# Patient Record
Sex: Female | Born: 1955 | Race: White | Hispanic: No | Marital: Married | Smoking: Never smoker
Health system: Southern US, Community
[De-identification: ages and names within clinical notes are randomized; demographics above are authoritative.]

## PROBLEM LIST (undated history)

## (undated) DIAGNOSIS — M1990 Unspecified osteoarthritis, unspecified site: Secondary | ICD-10-CM

## (undated) DIAGNOSIS — F419 Anxiety disorder, unspecified: Secondary | ICD-10-CM

## (undated) DIAGNOSIS — G500 Trigeminal neuralgia: Secondary | ICD-10-CM

## (undated) DIAGNOSIS — R51 Headache: Secondary | ICD-10-CM

## (undated) DIAGNOSIS — K219 Gastro-esophageal reflux disease without esophagitis: Secondary | ICD-10-CM

## (undated) DIAGNOSIS — F32A Depression, unspecified: Secondary | ICD-10-CM

## (undated) DIAGNOSIS — F329 Major depressive disorder, single episode, unspecified: Secondary | ICD-10-CM

## (undated) HISTORY — PX: EPIDURAL BLOCK INJECTION: SHX1516

## (undated) HISTORY — DX: Anxiety disorder, unspecified: F41.9

## (undated) HISTORY — PX: OTHER SURGICAL HISTORY: SHX169

## (undated) HISTORY — PX: TONSILLECTOMY: SUR1361

---
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
## 1975-10-10 HISTORY — PX: PILONIDAL CYST / SINUS EXCISION: SUR543

## 1987-10-10 HISTORY — PX: HAND ARTHROPLASTY: SHX968

## 1999-07-01 ENCOUNTER — Other Ambulatory Visit: Admission: RE | Admit: 1999-07-01 | Discharge: 1999-07-01 | Payer: Self-pay | Admitting: Obstetrics and Gynecology

## 1999-12-16 ENCOUNTER — Other Ambulatory Visit: Admission: RE | Admit: 1999-12-16 | Discharge: 1999-12-16 | Payer: Self-pay | Admitting: Obstetrics and Gynecology

## 2000-10-08 ENCOUNTER — Other Ambulatory Visit: Admission: RE | Admit: 2000-10-08 | Discharge: 2000-10-08 | Payer: Self-pay | Admitting: Obstetrics and Gynecology

## 2000-11-17 ENCOUNTER — Emergency Department (HOSPITAL_COMMUNITY): Admission: EM | Admit: 2000-11-17 | Discharge: 2000-11-17 | Payer: Self-pay | Admitting: *Deleted

## 2001-03-20 ENCOUNTER — Other Ambulatory Visit: Admission: RE | Admit: 2001-03-20 | Discharge: 2001-03-20 | Payer: Self-pay | Admitting: Obstetrics and Gynecology

## 2002-01-29 ENCOUNTER — Other Ambulatory Visit: Admission: RE | Admit: 2002-01-29 | Discharge: 2002-01-29 | Payer: Self-pay | Admitting: *Deleted

## 2003-04-22 ENCOUNTER — Other Ambulatory Visit: Admission: RE | Admit: 2003-04-22 | Discharge: 2003-04-22 | Payer: Self-pay | Admitting: *Deleted

## 2004-11-04 ENCOUNTER — Encounter: Admission: RE | Admit: 2004-11-04 | Discharge: 2004-11-04 | Payer: Self-pay | Admitting: Family Medicine

## 2005-02-09 ENCOUNTER — Other Ambulatory Visit: Admission: RE | Admit: 2005-02-09 | Discharge: 2005-02-09 | Payer: Self-pay | Admitting: *Deleted

## 2005-04-26 ENCOUNTER — Encounter: Admission: RE | Admit: 2005-04-26 | Discharge: 2005-04-26 | Payer: Self-pay | Admitting: Family Medicine

## 2005-10-09 HISTORY — PX: SINUS EXPLORATION: SHX5214

## 2005-10-09 HISTORY — PX: SEPTOPLASTY: SUR1290

## 2005-11-16 ENCOUNTER — Other Ambulatory Visit: Admission: RE | Admit: 2005-11-16 | Discharge: 2005-11-16 | Payer: Self-pay | Admitting: Obstetrics & Gynecology

## 2006-03-27 ENCOUNTER — Encounter: Admission: RE | Admit: 2006-03-27 | Discharge: 2006-03-27 | Payer: Self-pay | Admitting: Oral Surgery

## 2006-05-03 ENCOUNTER — Encounter: Admission: RE | Admit: 2006-05-03 | Discharge: 2006-05-03 | Payer: Self-pay | Admitting: Oral Surgery

## 2006-06-14 ENCOUNTER — Other Ambulatory Visit: Admission: RE | Admit: 2006-06-14 | Discharge: 2006-06-14 | Payer: Self-pay | Admitting: Obstetrics & Gynecology

## 2006-06-21 ENCOUNTER — Encounter: Admission: RE | Admit: 2006-06-21 | Discharge: 2006-06-21 | Payer: Self-pay | Admitting: Oral Surgery

## 2006-09-14 ENCOUNTER — Other Ambulatory Visit: Admission: RE | Admit: 2006-09-14 | Discharge: 2006-09-14 | Payer: Self-pay | Admitting: Obstetrics & Gynecology

## 2006-10-09 HISTORY — PX: ANKLE ARTHROPLASTY: SUR68

## 2007-03-28 ENCOUNTER — Other Ambulatory Visit: Admission: RE | Admit: 2007-03-28 | Discharge: 2007-03-28 | Payer: Self-pay | Admitting: Obstetrics & Gynecology

## 2007-10-10 HISTORY — PX: CARPAL TUNNEL RELEASE: SHX101

## 2007-10-10 HISTORY — PX: JOINT REPLACEMENT: SHX530

## 2007-11-11 ENCOUNTER — Encounter
Admission: RE | Admit: 2007-11-11 | Discharge: 2008-02-09 | Payer: Self-pay | Admitting: Physical Medicine & Rehabilitation

## 2007-11-11 ENCOUNTER — Ambulatory Visit: Payer: Self-pay | Admitting: Physical Medicine & Rehabilitation

## 2007-12-30 ENCOUNTER — Inpatient Hospital Stay (HOSPITAL_COMMUNITY): Admission: RE | Admit: 2007-12-30 | Discharge: 2008-01-03 | Payer: Self-pay | Admitting: Orthopedic Surgery

## 2008-02-20 ENCOUNTER — Other Ambulatory Visit: Admission: RE | Admit: 2008-02-20 | Discharge: 2008-02-20 | Payer: Self-pay | Admitting: Obstetrics & Gynecology

## 2008-03-01 ENCOUNTER — Encounter: Admission: RE | Admit: 2008-03-01 | Discharge: 2008-03-01 | Payer: Self-pay | Admitting: Orthopedic Surgery

## 2008-03-02 ENCOUNTER — Encounter
Admission: RE | Admit: 2008-03-02 | Discharge: 2008-05-31 | Payer: Self-pay | Admitting: Physical Medicine & Rehabilitation

## 2008-03-03 ENCOUNTER — Ambulatory Visit: Payer: Self-pay | Admitting: Physical Medicine & Rehabilitation

## 2008-03-23 ENCOUNTER — Ambulatory Visit: Payer: Self-pay | Admitting: Physical Medicine & Rehabilitation

## 2008-04-16 ENCOUNTER — Ambulatory Visit: Payer: Self-pay | Admitting: Physical Medicine & Rehabilitation

## 2008-04-24 ENCOUNTER — Ambulatory Visit: Payer: Self-pay | Admitting: Physical Medicine & Rehabilitation

## 2008-05-04 ENCOUNTER — Ambulatory Visit: Payer: Self-pay | Admitting: Physical Medicine & Rehabilitation

## 2008-05-29 ENCOUNTER — Encounter
Admission: RE | Admit: 2008-05-29 | Discharge: 2008-08-27 | Payer: Self-pay | Admitting: Physical Medicine & Rehabilitation

## 2008-06-01 ENCOUNTER — Ambulatory Visit: Payer: Self-pay | Admitting: Physical Medicine & Rehabilitation

## 2008-06-11 ENCOUNTER — Ambulatory Visit: Payer: Self-pay | Admitting: Internal Medicine

## 2008-06-25 ENCOUNTER — Ambulatory Visit: Payer: Self-pay | Admitting: Internal Medicine

## 2008-06-30 ENCOUNTER — Ambulatory Visit: Payer: Self-pay | Admitting: Physical Medicine & Rehabilitation

## 2008-07-03 ENCOUNTER — Other Ambulatory Visit: Admission: RE | Admit: 2008-07-03 | Discharge: 2008-07-03 | Payer: Self-pay | Admitting: Obstetrics & Gynecology

## 2008-07-28 ENCOUNTER — Ambulatory Visit: Payer: Self-pay | Admitting: Physical Medicine & Rehabilitation

## 2008-08-06 ENCOUNTER — Ambulatory Visit: Payer: Self-pay | Admitting: Physical Medicine & Rehabilitation

## 2008-08-11 ENCOUNTER — Ambulatory Visit (HOSPITAL_BASED_OUTPATIENT_CLINIC_OR_DEPARTMENT_OTHER): Admission: RE | Admit: 2008-08-11 | Discharge: 2008-08-11 | Payer: Self-pay | Admitting: Orthopedic Surgery

## 2008-08-27 ENCOUNTER — Ambulatory Visit: Payer: Self-pay | Admitting: Physical Medicine & Rehabilitation

## 2008-09-09 ENCOUNTER — Ambulatory Visit: Payer: Self-pay | Admitting: Physical Medicine & Rehabilitation

## 2008-09-09 ENCOUNTER — Encounter
Admission: RE | Admit: 2008-09-09 | Discharge: 2008-12-08 | Payer: Self-pay | Admitting: Physical Medicine & Rehabilitation

## 2008-09-21 ENCOUNTER — Ambulatory Visit: Payer: Self-pay | Admitting: Physical Medicine & Rehabilitation

## 2008-10-05 ENCOUNTER — Ambulatory Visit: Payer: Self-pay | Admitting: Physical Medicine & Rehabilitation

## 2008-10-09 HISTORY — PX: KNEE ARTHROPLASTY: SHX992

## 2008-10-20 ENCOUNTER — Ambulatory Visit: Payer: Self-pay | Admitting: Physical Medicine & Rehabilitation

## 2008-10-29 ENCOUNTER — Ambulatory Visit: Payer: Self-pay | Admitting: Physical Medicine & Rehabilitation

## 2008-11-11 ENCOUNTER — Ambulatory Visit (HOSPITAL_COMMUNITY): Admission: RE | Admit: 2008-11-11 | Discharge: 2008-11-11 | Payer: Self-pay | Admitting: Orthopedic Surgery

## 2008-11-12 ENCOUNTER — Other Ambulatory Visit: Admission: RE | Admit: 2008-11-12 | Discharge: 2008-11-12 | Payer: Self-pay | Admitting: Obstetrics & Gynecology

## 2008-11-12 ENCOUNTER — Ambulatory Visit: Payer: Self-pay | Admitting: Physical Medicine & Rehabilitation

## 2008-11-26 ENCOUNTER — Ambulatory Visit: Payer: Self-pay | Admitting: Physical Medicine & Rehabilitation

## 2008-12-09 ENCOUNTER — Encounter
Admission: RE | Admit: 2008-12-09 | Discharge: 2009-01-07 | Payer: Self-pay | Admitting: Physical Medicine & Rehabilitation

## 2008-12-10 ENCOUNTER — Ambulatory Visit: Payer: Self-pay | Admitting: Physical Medicine & Rehabilitation

## 2008-12-31 ENCOUNTER — Ambulatory Visit: Payer: Self-pay | Admitting: Physical Medicine & Rehabilitation

## 2009-02-08 ENCOUNTER — Inpatient Hospital Stay (HOSPITAL_COMMUNITY): Admission: RE | Admit: 2009-02-08 | Discharge: 2009-02-11 | Payer: Self-pay | Admitting: Orthopedic Surgery

## 2010-05-07 ENCOUNTER — Encounter: Admission: RE | Admit: 2010-05-07 | Discharge: 2010-05-07 | Payer: Self-pay | Admitting: Anesthesiology

## 2010-06-02 ENCOUNTER — Encounter: Admission: RE | Admit: 2010-06-02 | Discharge: 2010-06-02 | Payer: Self-pay | Admitting: Gastroenterology

## 2010-06-10 ENCOUNTER — Encounter: Admission: RE | Admit: 2010-06-10 | Discharge: 2010-06-10 | Payer: Self-pay | Admitting: Gastroenterology

## 2010-09-05 ENCOUNTER — Ambulatory Visit (HOSPITAL_COMMUNITY)
Admission: RE | Admit: 2010-09-05 | Discharge: 2010-09-05 | Payer: Self-pay | Source: Home / Self Care | Admitting: Orthopedic Surgery

## 2010-10-10 ENCOUNTER — Encounter
Admission: RE | Admit: 2010-10-10 | Discharge: 2010-11-08 | Payer: Self-pay | Source: Home / Self Care | Attending: Physical Medicine & Rehabilitation | Admitting: Physical Medicine & Rehabilitation

## 2010-10-11 ENCOUNTER — Ambulatory Visit
Admission: RE | Admit: 2010-10-11 | Discharge: 2010-10-11 | Payer: Self-pay | Source: Home / Self Care | Attending: Physical Medicine & Rehabilitation | Admitting: Physical Medicine & Rehabilitation

## 2010-10-27 ENCOUNTER — Ambulatory Visit
Admission: RE | Admit: 2010-10-27 | Discharge: 2010-10-27 | Payer: Self-pay | Source: Home / Self Care | Attending: Physical Medicine & Rehabilitation | Admitting: Physical Medicine & Rehabilitation

## 2010-10-30 ENCOUNTER — Encounter: Payer: Self-pay | Admitting: Orthopedic Surgery

## 2010-11-17 ENCOUNTER — Encounter (HOSPITAL_BASED_OUTPATIENT_CLINIC_OR_DEPARTMENT_OTHER): Payer: BC Managed Care – PPO | Admitting: Physical Medicine & Rehabilitation

## 2010-11-17 ENCOUNTER — Encounter: Payer: BC Managed Care – PPO | Attending: Physical Medicine & Rehabilitation

## 2010-11-17 DIAGNOSIS — G500 Trigeminal neuralgia: Secondary | ICD-10-CM

## 2010-11-17 DIAGNOSIS — M549 Dorsalgia, unspecified: Secondary | ICD-10-CM | POA: Insufficient documentation

## 2010-11-17 DIAGNOSIS — M79609 Pain in unspecified limb: Secondary | ICD-10-CM | POA: Insufficient documentation

## 2010-12-01 ENCOUNTER — Ambulatory Visit (HOSPITAL_BASED_OUTPATIENT_CLINIC_OR_DEPARTMENT_OTHER): Payer: BC Managed Care – PPO | Admitting: Physical Medicine & Rehabilitation

## 2010-12-01 DIAGNOSIS — G500 Trigeminal neuralgia: Secondary | ICD-10-CM

## 2010-12-01 DIAGNOSIS — M545 Low back pain, unspecified: Secondary | ICD-10-CM

## 2010-12-01 DIAGNOSIS — IMO0002 Reserved for concepts with insufficient information to code with codable children: Secondary | ICD-10-CM

## 2010-12-19 ENCOUNTER — Encounter: Payer: BC Managed Care – PPO | Attending: Physical Medicine & Rehabilitation

## 2010-12-19 ENCOUNTER — Ambulatory Visit (HOSPITAL_BASED_OUTPATIENT_CLINIC_OR_DEPARTMENT_OTHER): Payer: BC Managed Care – PPO | Admitting: Physical Medicine & Rehabilitation

## 2010-12-19 DIAGNOSIS — G500 Trigeminal neuralgia: Secondary | ICD-10-CM

## 2010-12-19 DIAGNOSIS — M549 Dorsalgia, unspecified: Secondary | ICD-10-CM | POA: Insufficient documentation

## 2010-12-19 DIAGNOSIS — M79609 Pain in unspecified limb: Secondary | ICD-10-CM | POA: Insufficient documentation

## 2010-12-21 ENCOUNTER — Other Ambulatory Visit (HOSPITAL_COMMUNITY): Payer: Self-pay | Admitting: Gastroenterology

## 2010-12-21 DIAGNOSIS — IMO0001 Reserved for inherently not codable concepts without codable children: Secondary | ICD-10-CM

## 2011-01-09 ENCOUNTER — Ambulatory Visit (HOSPITAL_BASED_OUTPATIENT_CLINIC_OR_DEPARTMENT_OTHER): Payer: BC Managed Care – PPO | Admitting: Physical Medicine & Rehabilitation

## 2011-01-09 ENCOUNTER — Encounter: Payer: BC Managed Care – PPO | Attending: Physical Medicine & Rehabilitation

## 2011-01-09 DIAGNOSIS — IMO0002 Reserved for concepts with insufficient information to code with codable children: Secondary | ICD-10-CM

## 2011-01-09 DIAGNOSIS — M549 Dorsalgia, unspecified: Secondary | ICD-10-CM | POA: Insufficient documentation

## 2011-01-09 DIAGNOSIS — M79609 Pain in unspecified limb: Secondary | ICD-10-CM | POA: Insufficient documentation

## 2011-01-12 ENCOUNTER — Other Ambulatory Visit (HOSPITAL_COMMUNITY): Payer: BC Managed Care – PPO

## 2011-01-17 LAB — BASIC METABOLIC PANEL
BUN: 14 mg/dL (ref 6–23)
BUN: 5 mg/dL — ABNORMAL LOW (ref 6–23)
BUN: 6 mg/dL (ref 6–23)
CO2: 28 mEq/L (ref 19–32)
CO2: 30 mEq/L (ref 19–32)
Calcium: 8 mg/dL — ABNORMAL LOW (ref 8.4–10.5)
Calcium: 8.2 mg/dL — ABNORMAL LOW (ref 8.4–10.5)
Chloride: 104 mEq/L (ref 96–112)
Creatinine, Ser: 0.72 mg/dL (ref 0.4–1.2)
Creatinine, Ser: 0.8 mg/dL (ref 0.4–1.2)
GFR calc Af Amer: >60 mL/min (ref >60)
GFR calc non Af Amer: >60 mL/min (ref >60)
Glucose, Bld: 117 mg/dL — ABNORMAL HIGH (ref 70–99)
Glucose, Bld: 151 mg/dL — ABNORMAL HIGH (ref 70–99)
Potassium: 3.7 mEq/L (ref 3.5–5.1)

## 2011-01-17 LAB — CBC
HCT: 27 % — ABNORMAL LOW (ref 36.0–46.0)
MCHC: 34.5 g/dL (ref 30.0–36.0)
MCHC: 34.8 g/dL (ref 30.0–36.0)
MCV: 92.4 fL (ref 78.0–100.0)
MCV: 93.3 fL (ref 78.0–100.0)
Platelets: 121 10*3/uL — ABNORMAL LOW (ref 150–400)
Platelets: 149 10*3/uL — ABNORMAL LOW (ref 150–400)
RBC: 2.7 MIL/uL — ABNORMAL LOW (ref 3.87–5.11)
RDW: 13.3 % (ref 11.5–15.5)

## 2011-01-17 LAB — ABO/RH: ABO/RH(D): O POS

## 2011-01-17 LAB — PROTIME-INR: Prothrombin Time: 14.9 seconds (ref 11.6–15.2)

## 2011-01-18 LAB — PROTIME-INR
INR: 1 (ref 0.00–1.49)
Prothrombin Time: 13.7 seconds (ref 11.6–15.2)

## 2011-01-18 LAB — APTT: aPTT: 35 seconds (ref 24–37)

## 2011-01-18 LAB — COMPREHENSIVE METABOLIC PANEL
AST: 20 U/L (ref 0–37)
BUN: 12 mg/dL (ref 6–23)
CO2: 26 mEq/L (ref 19–32)
Calcium: 9.1 mg/dL (ref 8.4–10.5)
Creatinine, Ser: 0.91 mg/dL (ref 0.4–1.2)
GFR calc Af Amer: >60 mL/min (ref >60)
GFR calc non Af Amer: >60 mL/min (ref >60)

## 2011-01-18 LAB — CBC
HCT: 39.6 % (ref 36.0–46.0)
MCHC: 33.9 g/dL (ref 30.0–36.0)
MCV: 93.8 fL (ref 78.0–100.0)
RBC: 4.23 MIL/uL (ref 3.87–5.11)

## 2011-01-18 LAB — URINALYSIS, ROUTINE W REFLEX MICROSCOPIC
Bilirubin Urine: NEGATIVE
Hgb urine dipstick: NEGATIVE
Protein, ur: NEGATIVE mg/dL
Specific Gravity, Urine: 1.022 (ref 1.005–1.030)
Urobilinogen, UA: 1 mg/dL (ref 0.0–1.0)

## 2011-01-30 ENCOUNTER — Ambulatory Visit (HOSPITAL_BASED_OUTPATIENT_CLINIC_OR_DEPARTMENT_OTHER): Payer: BC Managed Care – PPO | Admitting: Physical Medicine & Rehabilitation

## 2011-01-30 DIAGNOSIS — G500 Trigeminal neuralgia: Secondary | ICD-10-CM

## 2011-01-30 DIAGNOSIS — M533 Sacrococcygeal disorders, not elsewhere classified: Secondary | ICD-10-CM

## 2011-01-31 NOTE — Assessment & Plan Note (Signed)
Brittney [name] was originally here for her trigeminal neuralgia, but she has additional concerns about coccygeal pain.  She has had 1 week of relief following S1 injection; however, she has had prolonged relief from her sciatic type symptoms status post S1 injection performed on January 09, 2011.  She has had no falls.  She has had no new medical problems.  REVIEW OF SYSTEMS:  Numbness, tingling, depression, anxiety.  She has some bad days where she wishes she had something more than tramadol to take.  MEDICATIONS:  Tramadol 50 mg t.i.d., she is also using Lidoderm patch. She uses lidocaine cream now that her patch has not been covered.  She is on Robaxin 500 b.i.d.  Blood pressure 120/72, pulse 69, respirations 18, O2 sat 99% on room air.  Gait is normal.  Mood and affect are appropriate.  IMPRESSION:  Coccygeal pain.  As it is discussed with the patient that it was not primary reason to do the epidural at S1.  Her sciatic pain has been relieved as was expected down the right lower extremity.  We will set her up for coccygeal injection in a couple of weeks.  I did discuss that this may or may not provide any long-lasting relief.     Erick Colace, M.D. Electronically Signed    AEK/MedQ D:  01/30/2011 15:08:49  T:  01/31/2011 01:20:09  Job #:  [id number]

## 2011-01-31 NOTE — Procedures (Signed)
NAMEAUTUMN, Brittney [name]            ACCOUNT NO.:  [id number]  MEDICAL RECORD NO.:  [id number]           PATIENT TYPE:  O  LOCATION:  [facility]                          FACILITY:  [facility]  PHYSICIAN:  Erick Colace, M.D.DATE OF BIRTH:  1955/12/02  DATE OF PROCEDURE:  01/30/2011 DATE OF DISCHARGE:                              OPERATIVE REPORT  This is a right sphenopalatine ganglion block.  She has had about 4 weeks relief with her last procedure performed on December 19, 2010.  This last week it has been wearing off.  She had 1-week relief after epidural steroid injection at S1.  She states that 1-week relief was really the relief on her coccyx.  She has had prolonged relief after the S1 injection for her lower extremity pain.  INDICATIONS FOR PROCEDURE:  Trigeminal neuralgia, only partially responsive to medication management.  She has had untoward side effects with oral medications.  Informed consent was obtained after describing risks and benefits of the procedure with the patient.  This include bleeding, epistaxis, and infection.  She elects to proceed.  The patient placed in a supine position.  The patient had swabs inserted x2 into the right nares that were presoaked in 4% cocaine solution.  Additionally, 2 mL of cocaine solution 4% was dripped along the swabs into the right nares.  The patient tolerated procedure well, 30-minute application time.  Repeat in 4 weeks.     Erick Colace, M.D. Electronically Signed    AEK/MEDQ  D:  01/30/2011 15:06:18  T:  01/31/2011 01:13:29  Job:  [id number]

## 2011-02-17 ENCOUNTER — Encounter: Payer: BC Managed Care – PPO | Attending: Physical Medicine & Rehabilitation

## 2011-02-17 ENCOUNTER — Ambulatory Visit (HOSPITAL_BASED_OUTPATIENT_CLINIC_OR_DEPARTMENT_OTHER): Payer: BC Managed Care – PPO | Admitting: Physical Medicine & Rehabilitation

## 2011-02-17 DIAGNOSIS — M549 Dorsalgia, unspecified: Secondary | ICD-10-CM | POA: Insufficient documentation

## 2011-02-17 DIAGNOSIS — G500 Trigeminal neuralgia: Secondary | ICD-10-CM

## 2011-02-17 DIAGNOSIS — M79609 Pain in unspecified limb: Secondary | ICD-10-CM | POA: Insufficient documentation

## 2011-02-20 NOTE — Procedures (Signed)
Brittney [name], Brittney [name]            ACCOUNT NO.:  [id number]  MEDICAL RECORD NO.:  [id number]           PATIENT TYPE:  LOCATION:                                 FACILITY:  PHYSICIAN:  Erick Colace, M.D.DATE OF BIRTH:  Oct 22, 1955  DATE OF PROCEDURE: DATE OF DISCHARGE:                              OPERATIVE REPORT  PROCEDURE:  Sphenopalatine ganglion block, right naris.  SURGEON:  Erick Colace, MD  A 55 year old female with trigeminal neuralgia affecting the right V1-V2 areas.  She is here for sphenopalatine ganglion block.  She was getting good relief with q.2 week and q.2-3 weeks, but she went 6 weeks between and now has reset the usual.  Informed consent was obtained after describing risks and benefits of the procedure with the patient.  These include bleeding, bruising, and infection.  She elects to proceed and has given written consent.  The patient was placed in a reclined position pledgets.  Pledgets swabs soaked in a total of 0.1 mL of 4% cocaine solution inserted into the right naris x2.  Then, an additional 0.4 mL of the 4% cocaine solution was dripped along the pledgets into the naris on the right.  The patient tolerated the procedure well.  We will repeat in 2 weeks.  I have already also given her a prescription for lidocaine 4% spray 1-2 sprays right nostril q.i.d.     Erick Colace, M.D. Electronically Signed    AEK/MEDQ  D:  02/17/2011 10:31:09  T:  02/17/2011 23:52:13  Job:  [id number]

## 2011-02-21 NOTE — Assessment & Plan Note (Signed)
Brittney [name] returns today.  I saw her in initial consultation for  facial pain back in February 2009.  This has actually resolved after  some acupuncture treatment.  In the interval time period, she has  undergone right total knee replacement in March 2009.  She has had  initially good postoperative recovery; however, starting several weeks  ago, she started having increasing pain during physical therapy just  below her knee.  She was seen by her orthopedist who checked knee films,  and that looked fine.  She has had continued pain and has undergone  lumbar MRI, given that she has had some back pain and some pain in her  buttock and down her thigh.   I was able to review this MRI as well as the radiology interpretation.  She has no compressive lesions in the lumbar spine, minimal disk bulges,  no foraminal stenosis, and no lateral recess stenosis to explain any  symptomatology.  She does have some facet arthropathy, but once again  not narrowing any neural foramina.   Another concern is that she has been going up on her Percocet dose.  She  has been taking as many as 10 a day of the 10 mg.  She would like to go  down on this again.  She has liver concerns.  She is not on any  antiinflammatory at the current time.   Her pain is described as intermittent, sharp, stabbing, as well as some  tingling and aching on the top of the foot.  She has an average pain of  7-8, but currently down to 3-4/10.  Her relief from meds is fair.  She  can walk 20 minutes at a time.  She can climb steps, but this is  difficult.  Driving is a bit difficult.  She uses cane since her  surgery.  She is limited in her household duties especially yard work.   REVIEW OF SYSTEMS:  As above.  Positive depression.  She also has mild  constipation.   SOCIAL HISTORY:  She is living with her partner.  Denies any alcohol  abuse or drug abuse.   Further interval medical history is negative.   MEDICATIONS:  Include  Midrin, Wellbutrin, cyclobenzaprine, and Lexapro.   OBJECTIVE:  GENERAL:  No acute distress.  Mood and affect is  appropriate.  Her back has some mild tenderness in right lumbar paraspinal.  She has  more pain with left lateral bending than right.  She has full range in  the lumbar spine with forward flexion, extension, and lateral rotation  and bending.  She has no tenderness to palpation over her hips.  She has  good internal and external rotation of her hip.  She has no  hypersensitivity to touch over her lower extremities.  She has normal  pulses.  No evidence of lower extremity swelling or erythema.  No  excessive sweating or dryness.   She has some tenderness over the origin of the tibialis anterior and  pain with resisted dorsiflexion.  She is able to toe walk and heel walk  although heel walk is causing pulling behind her legs.  She also has  some tenderness at the vastus lateralis insertion on the right side.   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  I think it is from the way she has been performing her  knee extension exercises with ankle in the dorsiflexed position.  I  asked her to  do it in a plantar flexed position and to continue  stretching the knees.  She does have some loss of her previously gained  range.   I did not see any evidence of reflex sympathetic dystrophy.  I do not  think this is a radicular pain.   She will follow up with Dr. Turner Daniels.  She has asked me to put her on a  narcotic taper, and I have done that.  At this point, I told her not to  accept any pain medications from any other doctors except from me.  I  have written prescription for Percocet 10/325 mg one tablet p.o. 7 times  per day x1 week, one p.o. 6 times a day x1 week, one  p.o. 5 times a day x1 week, and one p.o. 4 times a day x1 week.  I will  see her back in one month.  I wrote for 154 tablets.  I have added some  nabumetone 1000 mg per  day.  She has tolerated this in the past.      Brittney [name], M.D.  Electronically Signed     AEK/MedQ  D:  03/03/2008 11:25:23  T:  03/04/2008 02:39:28  Job #:  [id number]   cc:   Feliberto Gottron. Turner Daniels, M.D.  Fax: [phone]   Carola J. Gerri Spore, M.D.  Fax: [phone]
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
## 2011-02-21 NOTE — Discharge Summary (Signed)
Brittney [name], Brittney [name]            ACCOUNT NO.:  [id number]   MEDICAL RECORD NO.:  [id number]          PATIENT TYPE:  INP   LOCATION:  1617                         FACILITY:  [facility]   PHYSICIAN:  Ollen Gross, M.D.    DATE OF BIRTH:  04-Apr-1956   DATE OF ADMISSION:  02/08/2009  DATE OF DISCHARGE:  02/11/2009                               DISCHARGE SUMMARY   ADMISSION DIAGNOSES:  1. Painful right total knee.  2. Migraines.  3. Past history of bronchitis.  4. Depression.  5. Postmenopausal.  6. Childhood illnesses of measles.   DISCHARGE DIAGNOSES:  1. Loose right total knee arthroplasty, status post right total knee      arthroplasty revision.  2. Acute postop blood loss anemia, did not require transfusion.  3. Mild postop hyponatremia.  4. Migraines.  5. Past history of bronchitis.  6. Depression.  7. Postmenopausal.  8. Childhood illnesses of measles.   PROCEDURE:  On Feb 08, 2009, right total knee arthroplasty revision.   SURGEON:  Dr. Lequita Halt.   ASSESSMENT:  Brittney Perkins, PA-C.   TOURNIQUET TIME:  42 minutes, then down for 10 minutes, then up an  additional 17 minutes.   CONSULTS:  None.   BRIEF HISTORY:  Brittney [name] is a 55 year old female with a right total knee  done by Dr. Carlean Jews a little over a year ago.  She has had progressive  worsening pain and dysfunction.  She has tried numerous nonoperative  modalities.  Recent bone scan showed apparent loosening of the tibial  and femoral component.  Negative infection workup.  Now presents for  revision arthroplasty.   LABORATORY DATA:  Preop CBC showed hemoglobin 13.4, hematocrit 39.6,  white cell count 4.7 and platelets 201,000.  PT/INR 13.7 and 1 with PTT  35.  Chem panel on admission was all within normal limits.  Preop UA was  negative.  Serial CBCs followed throughout the hospital course.  Hemoglobin dropped down to 10.5 and then 9.4.  Last known H and H were  8.9 and 25.  Serial protimes followed per  Coumadin protocol.  Last PT  and INR were 26.9 and 2.3.  Serial BMPs were followed.  Sodium did drop  from 139 to 134 and went back up to 139.  Remaining electrolytes  remained within normal limits.   EKG on January 29, 2009, revealed normal sinus rhythm, rightward axis, no  specific changes.  Last tracing performed by Dr. Willa Rough.   HOSPITAL COURSE:  The patient was admitted to [facility] and  was taken to the OR.  Underwent above said procedure without  complication.  The patient tolerated procedure well.  Later transferred  to the recovery room on the orthopedic floor.  She developed some  itching and was given some Nubain.  She had moderate pain that evening  of surgery and was doing a little bit better on the morning of day one.  She still had a little bit of itching and tried another dose of the  Nubain.  She apparently had some type idiosyncratic reaction where she  had increasing pain and just started developing  jitters and severe  pain in the leg likely due to some spasms, so we discontinued the Nubain  and gave her some muscle relaxants in the form of Valium.  This greatly  helped to reduce her spasms.  She was feeling much better by that  evening.  She started getting up out of bed.  She was diuresing fluids  fairly well and had good output in her Foley.  She was seen on the  morning of day two and was doing much better.  Pain was under much  better control.  We switched the Valium over to Flexeril which she had  been on in the past to help out with spasms.  She was tolerating her  meds.  Her sodium was down a little bit probably due to a delusional  component.  We discontinued the PCA and the fluids at that time.  She  continued to progress well with physical therapy.  By the morning of day  three, her sodium was back up.  She was taking pain pills by mouth and  walking well.  Dressing changed on day two.  Looked good on day three,  and she was discharged  home.   DISCHARGE PLAN:  The patient was discharged home on Feb 12, 2009.   DISCHARGE DIAGNOSIS:  Please see above.   DISCHARGE MEDICATIONS:  1. Percocet.  2. Flexeril.  3. Nu-Iron.  4. Coumadin.   FOLLOWUP:  Follow up next Thursday, Feb 18, 2009.  Call the office for  appointment.   ACTIVITY:  She is weightbearing as tolerated per total knee protocol.  Home health PT and home health nursing.  Coumadin for DVT prophylaxis  for three weeks.   DISPOSITION:  Home.   CONDITION ON DISCHARGE:  Improved.      Brittney [name], P.A.C.      Ollen Gross, M.D.  Electronically Signed    ALP/MEDQ  D:  02/11/2009  T:  02/11/2009  Job:  [id number]   cc:   Ollen Gross, M.D.  Fax: [phone]   Erick Colace, M.D.  Fax: [phone]   Carola J. Gerri Spore, M.D.  Fax: [phone]

## 2011-02-21 NOTE — Assessment & Plan Note (Signed)
A 55 year old female with right lower extremity chronic postoperative  knee pain.  She was last seen by me approximately 10 days ago.  We have  been giving her some hydrocodone for breakthrough pain.  She is also on  Duragesic patch.  She states in the interval time, she has been having  increasing night sweats and these are felt to be hot flashes.  She is  also using a electric blanket.  She feels that she has some dizziness  associated with these hot flashes; however, she is wondering whether it  can be medication related.   Average pain is 5-6/10 described as stabbing, radiating.  Current pain  is only at a 2-3/10 level; however, primarily in the lateral knee on the  right side and somewhat radiating along the tibia.   REVIEW OF SYSTEMS:  Positive for dizziness, confusion, depression, and  anxiety.  Negative for suicidal thoughts.  Positive for nausea,  constipation, poor appetite, and some coughing in the morning.   MEDICATIONS:  She has been changing around her Duragesic.  She states  that she drop to 25 mcg for a few days and then down to 12 and then she  stopped both for a day and then she went back on the 12 mcg, which she  is currently on right now.  She has 16 hydrocodone left from a  prescription of 60 from 10 days ago, indicating she has been taking more  than prescribed i.e., approximately 4 per day rather than 2 per day.   PHYSICAL EXAMINATION:  VITAL SIGNS:  Her blood pressure is 131/75, pulse  85, respirations 18 and O2 saturation 98% room air.  GENERAL:  No acute distress.  Mood and affect appropriate.  Gait is  normal.  EXTREMITIES:  She has mild crepitus over the right lateral knee as well  as left lateral knee, some mild tenderness to palpation in that region.  She has full strength in the lower extremities.  She has full range of  motion in the right knee.  No evidence of effusion.   IMPRESSION:  Chronic postoperative knee pain.  No evidence of reflex  sympathetic dystrophy.   PLAN:  Given that she is just on the 12 mcg Duragesic patch and she has  2 more patches after she finishes this one, we will have her change her  to every 3 days except for the last patch she will have it on for 4 days  that should take her out approximately 9 days from now.  She will go up  on hydrocodone 10/325 t.i.d. over the next 5-6 days and then she can  fill a prescription for hydrocodone 7.5/325 no sooner than November 03, 2008 and this will be for t.i.d. dosing.  When I see her back in 2  weeks, we will drop her further to 5 mg dosing and wean down from there.  Discussed with the patient.  She agrees with plan.  I have given her  written instructions as well.  I will see her in 2 weeks.      Erick Colace, M.D.  Electronically Signed     AEK/MedQ  D:  10/29/2008 12:33:20  T:  10/30/2008 02:02:51  Job #:  [id number]   cc:   Ollen Gross, M.D.  Fax: [phone]

## 2011-02-21 NOTE — Assessment & Plan Note (Signed)
Ms. Brittney [name] follows up today.  I last saw her on April 24, 2008.  She  had a right carpal tunnel injection which did help her quite a bit with  her hand pain.  She still has some numbness and tingling in that hand  but no longer painful, and she is really quite pleased with this.  Her  swelling is now gone after coming off the Lyrica.   In terms of her knee, we dropped her Duragesic from 75, which was  causing oversedation, to 50 x1 day, she had an extra patch, and then to  25, but she has had recurrence of knee pain and it seems to radiate a  little bit laterally along the leg more like it had been prior to upping  the dose.  Percocet has been making her feel depressed and would like  to stay off this if possible.   She has had no new medical problems in the interval time.   Her pain with activity is at a moderate level of 5-6, enjoyment of life  6-7.  She can walk 20 minutes at a time and climb steps.  She drives,  but this is uncomfortable.  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  Numbness, tingling, spasms, depression, and anxiety.  She indicates  suicidal thoughts but really more passive feelings rather than an active  plan.  She has nausea, constipation, and poor appetite.  She is living  with her partner.   PHYSICAL EXAMINATION:  VITAL SIGNS:  Blood pressure 113/57, pulse 82,  respiratory rate is 18, and O2 sat 99% on room air.  GENERAL:  No acute distress.  Mood and affect appropriate.  BACK:  No tenderness.  EXTREMITIES:  Her knee has mild tenderness along the insertion of the  TFL.  She is, by the way back, off her TFL exercises and all exercise  since her pain flared up.  Lower extremities without edema.  Her hand  has no intrinsic atrophy on the right.  She has good strength.  She has  a positive de Quervain sign as well as negative carpometacarpal stress  test.   IMPRESSION:  1. Chronic postoperative pain status post right total knee      replacement.  2. Right carpal  tunnel syndrome, improved after injection.  3. Right de Quervain tenosynovitis, mild, likely due to gardening      activities.   PLAN:  We will bump her back up to Duragesic 50 mcg.  We will  discontinue her Percocet, however.  I will recheck her in 1 month.  We  will continue Celebrex 200 mg a day, and she will restart her physical  therapy program with home exercises.      Erick Colace, M.D.  Electronically Signed     AEK/MedQ  D:  05/04/2008 11:28:58  T:  05/05/2008 02:10:40  Job #:  [id number]   cc:   Feliberto Gottron. Turner Daniels, M.D.  Fax: [phone]   [name], M.D.  Fax: [phone]

## 2011-02-21 NOTE — Op Note (Signed)
Brittney [name], Brittney [name]            ACCOUNT NO.:  [id number]   MEDICAL RECORD NO.:  [id number]          PATIENT TYPE:  AMB   LOCATION:  DSC                          FACILITY:  [facility]   PHYSICIAN:  Katy Fitch. Sypher, M.D. DATE OF BIRTH:  03/16/1956   DATE OF PROCEDURE:  08/11/2008  DATE OF DISCHARGE:                               OPERATIVE REPORT   PREOPERATIVE DIAGNOSES:  1. Chronic right carpal tunnel syndrome, median nerve entrapment at      wrist level.  2. 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   POSTOPERATIVE DIAGNOSES:  1. Chronic right carpal tunnel syndrome, median nerve entrapment at      wrist level.  2. 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   OPERATION:  1. Release of right transcarpal ligament.  2. Injection of right thumb carpometacarpal joint with Depo-Medrol and      lidocaine.   OPERATIONS:  Katy Fitch. Sypher, MD   ASSISTANT:  Marveen Reeks Dasnoit, PA-C   ANESTHESIA:  General by LMA.   SUPERVISING ANESTHESIOLOGIST:  Zenon Mayo, MD   INDICATIONS:  Brittney [name] is a 55-year-old woman referred for  evaluation and management of hand numbness and pain by Dr. Wynn Banker of  [facility].  She had electrodiagnostic studies at Dr.  Wynn Banker' office and was noted have signs of chronic median entrapment  neuropathy.   During her initial consult, we advised a trial of steroid injection and  splinting.  She had significant improvement for several weeks, but  subsequently developed recurrent numbness.  She now requests that we  proceed with the release of right transcarpal ligament.   Preoperatively, she also reported that her thumb CMC pain had become  aggravated.  We offered steroid injection while under general anesthesia  in an effort to diminish her thumb CMC pain.  After informed consent,  she is brought to the operating room at this time.   PROCEDURE:  Brittney [name] was brought to the operating  room and  placed in the supine position upon the operating table.   Following the induction general anesthesia by LMA technique, the right  arm was prepped with Betadine soap and solution, sterilely draped.  A  pneumatic tourniquet was applied to the proximal right brachium.  Following exsanguination of the right arm with an Esmarch bandage,  arterial tourniquet was inflated to 220 mmHg.  The procedure was  commenced with a short incision in line of the ring finger of the palm.  Subcutaneous tissues were carefully divided along the palmar fascia.  This was split longitudinally to reveal the common sensory branch of the  median nerve.  These were followed back to transcarpal ligament, which  was gently isolated from median nerve.  The ligament was released along  its ulnar border extending into the distal forearm.  This widely opened  the carpal canal.  No masses or predicaments were noted.   The wound was then repaired with intradermal 3-0 Prolene suture.  Attention was then directed to the thumb CMC joint.  The joint was  identified by palpation.  A needle was inserted through the thenar  muscles into the [facility] joint capsule and a mixture of Depo-Medrol 40  mg/mL, 1 mL and 2% lidocaine plain 1 mL was injected without  complication.  Good joint distention was achieved.   Brittney [name] was placed in a compressive dressing with a volar plaster  splint.  There were no apparent complications.    Note:  For aftercare, she is provided prescription for Percocet 7.5 mg 1  p.o. every 4-6 hours p.r.n. pain, 30 tablets without refill.  I will see  her back for followup in approximately 7-10 days for dressing change and  initiation of her therapy program.      Katy Fitch. Sypher, M.D.  Electronically Signed     RVS/MEDQ  D:  08/11/2008  T:  08/12/2008  Job:  [id number]   cc:   Erick Colace, M.D.

## 2011-02-21 NOTE — Assessment & Plan Note (Signed)
Brittney [name] has chronic postoperative pain status post right total knee  replacement performed.  She had knee replacement in March 2009.  She was  weaned from Duragesic patch 50 to 37 mcg by the time I saw her on  June 30, 2008.   She has been weaned now from 34 to 55.  When she down to 25, basically  felt like pain increased too much.  She put on another patch upon  recommendation from Dr. Thomasena Edis in this office, and felt like that patch  she put on was too strong, took off both patches, replaced in the next  day with two 25-mcg patches and took these off on July 21, 2008.  Called over here, switched to hydrocodone 7.5 q.i.d., gave her enough to  get her through until this visit.  She states she has had some  withdrawal-type reaction, although it feels to be letting up at the  current time.  She has had some nausea.  No vomiting.   I did get her note from Dr. Teressa Senter, who evaluated her basically offering  carpal tunnel release on the right side.  The patient states that  currently asymptomatic after carpal tunnel injection.   Average pain is 4-5.  It increases with activities, 6 or 7/10.  She has  a stabbing pain on the lateral aspect along the joint line of her right  knee.  Her pain exacerbating factors include walking, bending, standing  and improves with ice and medication.  Relief from meds is fair.  She  can walk 20-30 minutes at time.  She can climbs step.  She drives.   Her Oswestry questionnaire is 36%.   Other pertinent positives are she feels like she had some problems with  her depression lately, sees a counselor, is on Wellbutrin and Lexapro.   PHYSICAL EXAMINATION:  VITAL SIGNS:  Her blood pressure is 106/80, pulse  65, respirations 18, and O2 sats 100% on room air.  GENERAL:  A well-developed, well-nourished, female in no acute distress.  Orientation x3.  Affect is flat, but alert.  Gait is normal.  EXTREMITIES:  Her right knee has minimal effusion compared  to left knee.  She has a well-healed incision.  She has good range of motion of the  right knee.  She has good ankle range of motion as well as hip range of  motion on the right side and left side.  She has no popliteal fossa  mass.  She has had some medial and lateral joint line tenderness on the  right side.  She has some tenderness over the right, has pes anserine  bursa area, but no pain with resisted adduction.  She is able to cross  her right leg over the left leg.   IMPRESSION:  Chronic postoperative pain, history of total knee  replacement 7 months ago.  She is on a smaller dose of narcotic  analgesic overall.  She is on a total of 30 mg of hydrocodone per day,  which is about equivalent to 25-mcg Duragesic patch.  She states that  she has no problem sleeping due to pain, therefore we will reduce her  hydrocodone to t.i.d., but increase the strength to 10 mg.   I will see her back in 1 month.  Hope, she either decrease dosage or  frequency.  Her goal is to get off narcotic analgesics.  Of note is that  she had similar difficulty coming off narcotic analgesics in the past  for another  condition, but was eventually able to get off them.   She plans to follow up with Dr. Lequita Halt for a third opinion on her knee  pain.  [facility]      Erick Colace, M.D.  Electronically Signed     AEK/MedQ  D:  07/28/2008 09:00:56  T:  07/28/2008 11:23:24  Job #:  [id number]   cc:   Feliberto Gottron. Turner Daniels, M.D.  Fax: [phone]   [name], M.D.  Fax: [phone]   Katy Fitch. Sypher, M.D.  Fax: [phone]   Carola J. Gerri Spore, M.D.  Fax: [phone]   Ollen Gross, M.D.  Fax: [phone]

## 2011-02-21 NOTE — Assessment & Plan Note (Signed)
Ms. Bentivegna returns today.  She was last seen by me on August 06, 2008, in the interval time.  She has undergone a right carpal tunnel  release.  Postoperatively in addition to her Duragesic patch, she did  take Percocet.  She states that helped not only her hand but also her  knee.   In the interval time, she has also seen Dr. Ollen Gross for an  additional opinion on her right knee pain.  She reports that her tibial  component of her knee replacement may be overhanging the tibial plateau  and causing an iliotibial band syndrome.  She has been placed in a knee  orthosis as well as a lateral heel wedge.  She is using the wedge right  now but does not have her knee brace on.  She thought that the knee  brace actually hurt her leg little bit more than helped when she first  started using it but is willing to give it a try.   Her average pain is 4-5/10, her recurrent pain 1-2/10.  Her current pain  medications, Duragesic patch she takes 25 mcg patch plus 12.  Her pain  is described as sharp, intermittent, and stabbing prominently in the  right knee.  Her right hand is really not hurting her anymore.  The pain  is worse with walking, bending, and standing.  Improves with heat and  medications.  Relief from meds is fair to good.  She walks without  assistance.  She can walk 20-30 minutes at a time.   REVIEW OF SYSTEMS:  Positive for trouble walking, depression, anxiety,  suicidal thoughts but really no plan.  She states this is more of a  passive thought and she continues to see her psychologist in regards to  this.   Overall, she states she is quite relieved that someone could find  something wrong with her knee that explains her symptoms.  Review of  systems is also positive for constipation and night sweats.   PHYSICAL EXAMINATION:  VITAL SIGNS:  Blood pressure 122/60, pulse 76,  respiratory rate is 18, and O2 sat 97% on room air.  GENERAL:  No acute distress.  Orientation x3.   Affect is alert.  Gait is  normal.  EXTREMITIES:  Without edema.  Coordination is normal.  Her knee has no  evidence of effusion.  Mild lateral tenderness.  Lower extremity  strength is good.   IMPRESSION:  Chronic postoperative knee pain, question if she has some  type of iliotibial band syndrome related to malaligned tibial component.  Certainly, defer to Dr. Lequita Halt in terms of this.   In terms of pain management, we will keep her on her current  medications, seems to be doing reasonably well.  She actually requests  to be placed on Percocet in addition to her Duragesic; however, that was  really just for her postoperative pain and I explained this to her.  She  is quite functional with the Duragesic alone.   I will see her back in about 2 months.  Nursing visit in 1 month.      Erick Colace, M.D.  Electronically Signed     AEK/MedQ  D:  08/27/2008 16:43:50  T:  08/28/2008 02:34:47  Job #:  [id number]   cc:   Ollen Gross, M.D.  Fax: [phone]   Katy Fitch. Sypher, M.D.  Fax: [phone]

## 2011-02-21 NOTE — Assessment & Plan Note (Signed)
Ms. Covin   DICTATION ENDED AT THIS POINT      Erick Colace, M.D.  Electronically Signed     AEK/MedQ  D:  11/26/2008 16:04:33  T:  11/27/2008 05:13:16  Job #:  [id number]

## 2011-02-21 NOTE — Assessment & Plan Note (Signed)
The patient returns today, I last saw her December 10, 2008.  She had some  facial pain start taking extra hydrocodone, but ran out early therefore  basically is off pain medications.  She has a rough 4 days with  withdrawal type reaction, but is over that now.  She is planning to have  surgery revision right total knee, May 3.   Pain is about 2-3/10, but averaging about 4.  She has depression  anxiety.  On review of systems weight gain, nausea, some night sweats,  but this basically past with the withdrawal reaction.   PHYSICAL EXAMINATION:  VITAL SIGNS:  Blood pressure 127/72, pulse 95,  respirations 80, O2 sat 99% on room air.  GENERAL:  No acute distress.  Orientation x3.  Affect alert.  Gait is  normal.  She is able to do partial squats on 1 leg on either side, but  can go much lower on the left side than the right side.   She has no evidence of knee effusion on the right side.  She has no  evidence of erythema.   Mild tenderness to the lateral aspect of the right knee.   IMPRESSION:  1. Chronic postoperative knee pain.  2. History of atypical facial pain.  We discussed the facial pain.      She had recent flare-up, may benefit from sphenopalatine ganglion      blocks if her acupuncture treatments do not work.   I have written hydrocodone 5/325 #30 that she can take on infrequent  basis prior to surgery.  I will not see her.  I will schedule as a  p.r.n. followup hopefully after the initial month's time that she come  off her pain medicines.  Dr. Sherlean [name] will supply her pain medicine  postoperatively.      Brittney [name], M.D.  Electronically Signed     AEK/MedQ  D:  12/31/2008 15:22:38  T:  01/01/2009 02:47:29  Job #:  [id number]

## 2011-02-21 NOTE — Assessment & Plan Note (Signed)
The patient returns today, last saw me 2 weeks ago here for chronic  postoperative knee pain, seen by Dr. Lequita Halt and is scheduled for  revision of the tibial component of her right total knee on Feb 08, 2009.  Her pain interferes with her normal activities.  She is limited her work  activity.  She has been trying to work out using elliptical and this  does flare up her knee pain somewhat, but she tries to keep going just  because of the other exercise benefit.   REVIEW OF SYSTEMS:  Positive depression, but negative for suicidal  thoughts.  She has some constipation and nausea, which is improving.  Her average pain is in the 4-5/10 range radiating down her shin  activity.   Her examination, blood pressure 117/65, pulse 76, respirations 18, and  O2 sats 99% on room air.  GENERAL:  No acute stress.  Orientation x3.  Affect is alert.  Gait is  normal.  EXTREMITIES:  Without edema.  She has mild tenderness over the medial  joint line and moderate over the lateral joint line.  She has no  evidence of knee effusion.  Her incisions are well healed.  Lower  extremity strength is normal in hip flexion, knee extension, and ankle  dorsiflexion.  Sensation is mildly reduced over the lateral aspect of  the knee on the right side.   IMPRESSION:  1. Chronic postoperative knee pain.  2. 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   PLAN:  Now that she is off her Duragesic, we will reduce her hydrocodone  to 7.5/325 p.o. t.i.d.  See her back in 3 weeks, at which time we will  switch to the 5 mg dosing.  She wishes to be on minimal doses of  medications when she goes in for surgery to help with postoperative pain  management.      Erick Colace, M.D.  Electronically Signed     AEK/MedQ  D:  12/10/2008 12:49:56  T:  12/11/2008 00:41:07  Job #:  [id number]   cc:   Ollen Gross, M.D.  Fax: [phone]

## 2011-02-21 NOTE — Assessment & Plan Note (Signed)
REASON FOR VISIT:  Followup on chronic postoperative knee pain.     Ms. Brittney [name] returns today.  She was last seen by me approximately 2  weeks ago.  We have had weaned of her opioid analgesics.  She started at  a 50 mcg dose of Duragesic patch is down to a 12 mcg dose.  She  supplemented her pain medications with hydrocodone 7.5/325 t.i.d.  She  has some withdrawal symptoms per her report, although she was only  dropped very slowly and utilized some Phenergan for this.  She is  currently taking three hydrocodone 7.5 today and then Duragesic 12 mcg  patch.  She would like to stay at the current dosage and resume taper  next week.   PHYSICAL EXAMINATION:  VITAL SIGNS:  Blood pressure is 134/77, pulse 72,  respirations 18, O2 sat 99% on room air.  GENERAL:  No acute stress.  Mood and affect appropriate.  Pain level is  4-5/10 on average.  Oswestry score of 42%.  EXTREMITIES:  Her knee shows no evidence of effusion.  Gait shows no  evidence of toe drag and knee instability.  She has no knee effusion.  She has mild tenderness to palpation of the knee joint.  Good strength  in lower extremity.  Good range of motion at the knee.   Review of 3-phase bone scan radiotracer increased in the right knee on  all 3 phases, read as consistent with aseptic loosening or infection.   IMPRESSION:  Chronic postoperative knee pain.  We will continue  Duragesic 12 mcg q.72 h.  We will continue hydrocodone 7.5/325 t.i.d.  and goal of continue the Duragesic in next visit and dropping the  hydrocodone versus keeping the hydrocodone the same and eliminating the  Duragesic.   Discussed with the patient and agrees with plan.      Erick Colace, M.D.  Electronically Signed     AEK/MedQ  D:  11/12/2008 13:02:05  T:  11/13/2008 03:46:24  Job #:  [id number]   cc:   Ollen Gross, M.D.  Fax: [phone]

## 2011-02-21 NOTE — Discharge Summary (Signed)
Brittney [name], Brittney [name]            ACCOUNT NO.:  [id number]   MEDICAL RECORD NO.:  [id number]          PATIENT TYPE:  INP   LOCATION:  5001                         FACILITY:  [facility]   PHYSICIAN:  Feliberto Gottron. Turner Daniels, M.D.   DATE OF BIRTH:  [birth year]   DATE OF ADMISSION:  12/30/2007  DATE OF DISCHARGE:  01/03/2008                               DISCHARGE SUMMARY   FINAL DIAGNOSIS:  End-stage degenerative joint disease of the right  knee.   PROCEDURE:  While in the hospital, right total knee arthroplasty.   HISTORY OF PRESENT ILLNESS:  The patient is a 55 year old woman who has  been followed for many years for arthritis of the right knee.  She had a  couple of knee arthroscopies and unfortunately has had increasing pain.  Her last arthroscopy did show some bone change in the medial femoral  condyle, medial tibial condyle, and she has failed conservative  treatment with anti-inflammatory medications, narcotics, pain  medications through pain management, anti-inflammatory medications,  cortisone injections, viscosupplementation.  Because of severe  persistent disabling pain, she elected right total knee arthroplasty.  Risks and benefits of surgery were discussed.  Questions were answered.  She wishes to proceed.   ALLERGIES:  KEFLEX, CODEINE, AND DARVOCET.   MEDICATIONS:  At the time of admission,  1. Wellbutrin 200 SR b.i.d.  2. Lexapro 20 mg daily.  3. Midrin 1 as needed.  4. Relafen 500 mg b.i.d.  5. Ambien 5 mg h.s. p.r.n. sleep.  6. Tylenol or naproxen occasionally as needed.   PAST MEDICAL HISTORY:  Usual childhood diseases, history of anemia,  depression, and DJD.   SOCIAL HISTORY:  Right knee scopes x2, left ankle scope, right wrist  scope, and septoplasty, no difficulties with GET.   SOCIAL HISTORY:  No tobacco.  Positive ethanol.  Occasional alcohol.  No  IV drug abuse.  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   FAMILY HISTORY:  Mother died at age  6, COPD and CAD.  Father died at  age 64 of old MI.   REVIEW OF SYSTEMS:  Positive for glasses, ear pain, toothache, hot  flashes, and headache.  She denies any shortness of breath or chest  pain.   PHYSICAL EXAMINATION:  VITAL SIGNS:  Temperature 98.2, pulse 68,  respirations 16, and blood pressure 115/82.  She is 5 feet and 360  pounds female.  HEAD:  Normocephalic and atraumatic.  EARS:  TMs are clear.  EYES:  Pupils are equal and reactive to light and accommodation.  NOSE:  Patent.  THROAT:  Midline.  NECK:  Supple with full range of motion.  CHEST:  Clear to auscultation and percussion.  HEART:  Regular rate and rhythm.  ABDOMEN:  Soft and nontender.  EXTREMITIES:  Right knee range of motion 5-115 degrees.  Positive for  crepitus pain.  Stable ligamentously.  NEUROLOGIC:  Neurovascularly intact.  No effusion.  SKIN:  Within normal limits with well-healed arthroscopy scars.   X-rays show minimal changes, but intraoperative photos show grade 4  focal and grade 3 chondromalacia throughout the knee.  Preoperative  labs  including CBC, CMP, chest x-ray, EKG, PT, and PTT are all within normal  limits with exception of PTT of 45.   HOSPITAL COURSE:  On the day of admission, the patient was taken to the  operating room at [facility], where she underwent a right total  knee arthroplasty using DePuy Sigma RP components, cemented, #3 femur,  #3 tibia, 34 mm patellar button, and 10-mm segment, RP spacer.  Medium  Hemovac was placed upon to the wound.  The patient was placed on  perioperative antibiotics.  A Foley catheter was placed preoperatively.  The patient was placed on postoperative Dilaudid pain-pump for pain  control and CPM was begun in the PACU.  Postoperative day #1, the  patient was awake and alert.  Pain was 10/10.  She was taking p.o. as  well.  No nausea or vomiting.  Positive flatus.  Vital signs were  stable.  Dressing was clean and dry.  Labs were pending.   Drain output  200 mL.  Urine output approximately 2100 mL.  Neurovascularly intact.  Using CPM and incentive spirometer without difficulty.  She had been  placed on Coumadin per pharmacy prophylaxis and Lovenox for bridging  both of which she was tolerating well.  Postop day #2, the patient was  complaining of painful night, was better that morning.  T-max 100.3,  ranging 90-98.  She was alert and oriented.  Dressing was dry.  Wound  edges were benign.  Calves were soft and nontender, and incentive  spirometer was encouraged.  PCA was discontinued.  Postoperative day #3,  the patient was awake and alert without nausea or vomiting.  She was  taking p.o. as well and had a positive BM.  Vital signs were stable.  Wounds were clean and dry.  She was neurovascularly intact.  Hemoglobin  9.4, INR 1.9.  She was afebrile.  For pain control, had been changed to  OxyContin 20 mg b.i.d. with breakthrough from rapid release Percocet  because of her pain level.  She was stable orthopedically but had not  yet met her physical therapy goals, so that continued.  Postop day #4,  the patient was complaining of moderate pain.  PT goals were going well,  but had not yet fully been met.  She was afebrile.  Vital signs were  stable.  INR 1.6.  Dressing was clean and dry.  Wound was benign.  She  was otherwise medically stable and improved orthopedically.  She was  discharged home after meeting her physical therapy goals.  Returning to  see Dr. Turner Daniels in 1 week's time for followup check.  Diet is regular.  Medications, OxyContin, OxyIR, Robaxin for spasm, Coumadin per [place]  with a target INR of 1.5 to 2.  Resume home meds as per home med rec  sheet.  Dressing changes daily or as needed.  She may shower seated.  She will need home helper and home health PT, CPM and VME per [facility].   DIAGNOSIS:  Once again final diagnosis for this admission end-stage  degenerative joint disease of the right  knee.   PROCEDURE:  Right total knee arthroplasty.      Laural [name]. Brittney [name].      Feliberto Gottron. Turner Daniels, M.D.  Electronically Signed    JBR/MEDQ  D:  01/29/2008  T:  01/30/2008  Job:  [id number]

## 2011-02-21 NOTE — Op Note (Signed)
Brittney [name], Brittney [name]            ACCOUNT NO.:  [id number]   MEDICAL RECORD NO.:  [id number]          PATIENT TYPE:  INP   LOCATION:  0007                         FACILITY:  [facility]   PHYSICIAN:  Ollen Gross, M.D.    DATE OF BIRTH:  1956/06/13   DATE OF PROCEDURE:  DATE OF DISCHARGE:                               OPERATIVE REPORT   PREOPERATIVE DIAGNOSIS:  Loose right total knee arthroplasty.   POSTOPERATIVE DIAGNOSIS:  Loose right total knee arthroplasty.   PROCEDURE:  Right total knee arthroplasty revision.   SURGEON:  Ollen Gross, M.D.   ASSISTANT:  Avel Peace, PA-C   ANESTHESIA:  Spinal.   BLOOD LOSS:  Minimal.   DRAIN:  Hemovac x1.   TOURNIQUET TIME:  Up 42 minutes at 300 mmHg, down 10 minutes, then up an  additional 17 minutes at 300 mmHg.   COMPLICATIONS:  None.   CONDITION.:  Stable to recovery room.   BRIEF CLINICAL NOTE:  Brittney [name] is a 55 year old female who had a right  total knee arthroplasty done by Dr. Turner Daniels a little over a year ago.  She  has had progressively worsening pain and dysfunction with the knee.  We  have tried numerous nonoperative modalities.  A recent bone scan did  show apparent loosening of the tibial and femoral component.  She has  had a negative infection workup.  She presents now for revision total  knee arthroplasty.   PROCEDURE IN DETAIL:  After successful administration of spinal  anesthetic, a tourniquet was placed high on her right thigh and her  right lower extremity prepped and draped in the usual sterile fashion.  The extremity was wrapped in Esmarch, knee flexed, tourniquet to 300  mmHg.  A midline incision was made with a 10 blade through the  subcutaneous tissue to the extensor mechanism.  A fresh blade was used  make a medial parapatellar arthrotomy.  Minimal fluid was encountered.  The soft tissue over the proximal medial tibia subperiosteally elevated  to the joint line with the knife and into the semimembranosus  bursa with  the Cobb elevator.  The soft tissue laterally was elevated with  attention being paid to avoid the patellar tendon on the tibial  tubercle.  The patella was then everted and the knee flexed 90 degrees.  The tibial polyethylene did not show any large defects.  I was able to  remove the tibial polyethylene from the tibial component.  The femoral  component was inspected.  There was no gross loosening.  I was easily  able to remove the femoral component, however, by disrupting the  interface between the component and bone.  We removed it with minimal,  if any, bone loss.  I then subluxed the tibia forward and disrupted the  interface between the tibial component and bone with an oscillating saw.  I barely had to disrupt it and the tibial component came out very  easily.  The tibial component was loose.  There was also a small amount  of overhang laterally.  Once removed, then the cement was removed from  both canals.  The canals were  copiously irrigated with saline solution  and then I reamed on the femoral side up to 16 mm, tibial side up to 13  mm.   The tibial retractors were placed and the extramedullary tibial cutting  guide was placed referencing proximally at the medial aspect of the  tibial tubercle and distally along the second metatarsal axis and tibial  crest.  The block is pinned to cut just below where the cement was.  I  resected probably about 3 to 4 mm of tibia altogether to get a nice  healthy bone surface.  I then did the broaching for the 29 mm sleeve for  rotational control.  We then did the proximal preparation with the  modular drill and keyhole punch for a size 2.5 tibia.  Note that the  previous tibia is a size 3 and it had a small amount of overhang; with  the 2.5, it was a perfect fit with no overhang at all.   The femur was then prepared.  I irrigated the canal again and placed the  16 mm reamer to serve as an intramedullary guide.  The distal  femoral  cut was made.  I had to go up to the +4 position to get good bone.  We  then did the sizer and size 2.5 is most appropriate.  The 2.5 cutting  block was placed and the anterior-posterior chamfer cuts made.  We then  placed a revision block with a spacer of 10 mm to get the appropriate  rotation.  The intercondylar cut was then made.  We then placed the  trial on the tibial side being an MBT revision tray size 2.5.  On the  femoral side we had a 2.5 posterior stabilized femur with a 16 x 75 stem  in a +2 position and 4 mm distal augments medial and lateral.  A great  fit on both bony surfaces.  I had to go up over 20 mm spacer to get the  appropriate stability.  Thus, I decided to go ahead and, on the tibial  side, placed 5 mm augments on each side medial and lateral with that 29  mm sleeve.  We then did that and with a 15-mm insert she has full  extension with excellent varus-valgus and anterior-posterior balance  throughout full range of motion.  We then let the tourniquet down for a  total time of 42 minutes.  A moist sponge is placed.  While the  tourniquet is down the components are assembled on the back table.  After 10 minutes we rewrapped leg in the Esmarch and reinflated the  tourniquet.  I removed the tibial trial and then sized the cement  restrictor which was a 4.  The size 4 restrictor was placed at the  appropriate depth in the tibial canal.  I thoroughly inspected the  patellar component and did a patellaplasty removing all the soft tissue  that was overlying the component.  We then cleaned all the bony surfaces  with pulsatile lavage for 3 batches of cement with 2 grams of  vancomycin.  Once ready for implantation, the cement was injected into  the tibial canal and the tibial component impacted into place.  Once  again, this was size 2.5 MBT tibia with a 29 sleeve and with 5 mm medial  and lateral augments.  We then cemented on the bony surface of the  femoral side  with a Press-Fit stem.  After the cement is placed and the  components were impacted then all extruded cement was removed.  The  trial 15 mm insert was placed, knee held in full extension and  additional cement removed.  Once the cement was fully hardened then the  permanent 15 mm posterior stabilized rotating platform insert was placed  into the tibial tray.  The wound was copiously irrigated with saline  solution and then FloSeal injected on the posterior capsule,  mediolateral and suprapatellar area.  A moist sponge is placed and the  tourniquet released for a second time of 17 minutes.  The sponge was  held for 2 minutes and then removed.  Minimal bleeding was encountered  was stopped with electrocautery.  The wound was again copiously  irrigated and the arthrotomy closed over a Hemovac drain with  interrupted #1 PDS.  Flexion against gravity was about 120 degrees.  The  subcu was closed with the 2-0 Vicryl and skin was closed with staples.  Drains hooked to suction.  Incision cleaned and dried and a bulky  sterile dressing applied.  She was placed into a knee immobilizer,  awakened, and transported to recovery in stable condition.      Ollen Gross, M.D.  Electronically Signed     FA/MEDQ  D:  02/08/2009  T:  02/08/2009  Job:  [id number]

## 2011-02-21 NOTE — Group Therapy Note (Signed)
REASON FOR CONSULTATION:  To evaluate facial pain, right sided, medial  eyebrow and bridge of nose towards the right side.  In addition, she has  right knee pain.   HISTORY:  The patient gives a history of a headache onset in 2005.  She  had been treated at [facility].  She states she was  tried on multiple medications, Lexapro and Thorazine.  She states that  she has quite a list but states that she had some difficulty with  medications and they made her very depressed.  She states she was  diagnosed with bipolar disorder but it was really the medications that  made her that way.  She states that her headaches were never classified  as migraines or cluster, perhaps tension.  She in addition has been seen  by an ENT up in [place], who did a septal deviation surgery and this  resulted in pain around the bridge of the nose and eyebrow area on the  right side.  She has seen an ENT at [facility] in regards to this  as well.  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  She was also seen by a neurologist at [facility] who  trialed some Neurontin, which was not helpful for her pain.  She has  seen an acupuncturist, Mr. Philis Nettle, who did some acupuncture for both her  facial pain as well as her knee pain.  It did nearly resolve her facial  pain.  It was gone for a couple of months, but then recurred more  recently.  Unfortunately, she has gone back to using rather high doses  of hydrocodone, even taking 10 to 12 per day, even though her primary  physician has recommended a maximum of eight.  She is concerned about  taking this medication due to liver concerns, but also due to the fact  that she is planning to get a right knee replacement and is concerned  about her postoperative pain management, taking this many medications.  She states that the last time she had surgery, she threw up the  medications for about two weeks and still had a lot of  postoperative  pain.  She has had a CT of the head in 2006, which was normal.   PAIN MEDICATIONS TRIED IN THE PAST:  1. Vicodin.  2. Percocet.   The patient has also gone to A-wave therapy which identified certain  triggers for her, headache pain including areas of her masseter as well  as the base of her skull on the right side.  She has had no injections  for her headache pain.   She has recently started Cymbalta about one week ago for marked  depression, as well as hopefully to help the pain.   In review of notes from her primary care physician, there have been  concerns about the patient not being able to regulate her pain  medications.   In terms of her right knee, she is planning to see Dr. Feliberto Gottron. Turner Daniels  tomorrow.  She is discussing a knee replacement surgery, as arthroscopic  surgery was not helpful.   Her pain is rated on an average of 2 but with activity a 4/10 level.  The pain improves with medications.  Relief with medications is fair,  although she thinks it is reduced the longer she has been on it.  She  can climb steps.  She drives.  She no longer exercises and  used to  exercise quite a bit, running three to four miles at a time, but really  has not in the last year and has gained some weight as well.  She has  depression but no suicidal thoughts.   PHYSICAL EXAMINATION:  VITAL SIGNS:  Blood pressure 121/70, pulse 78,  respirations 18, O2 saturation 98% on room air.  GENERAL:  In no acute distress.  MUSCULOSKELETAL/NEUROLOGIC:  Back is appropriate.  Gait is normal,  though a bit stiff with the right knee.  She does have a right knee  effusion which is mild to moderate.  Her cranial nerves II-XII  are  intact.  Her neck range of motion is normal.  She has tenderness in the  bilateral upper trapezius as well as occipital area.  Her motor is 5/5  bilateral deltoid, biceps, triceps and grip, as well as hip flexor and  knee extensor and ankle dorsiflexion.  Her  sensation is normal in C5,  C6, C7, C8 and T1 dermatomes, as well as L3, L4, L5 and S1 dermatomes.  She has normal sensation over V1, V2 and V3 bilaterally in the facial  region.  She has no evidence of a facial droop.  She has a little  tenderness over the sinuses.   IMPRESSION:  Atypical facial pain.  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   RECOMMENDATIONS:  The patient's main goal is to get off of pain medicine  as soon as she can.  She is concerned about postoperative pain  management.  We did discuss weaning.  She states that the last weaning  she was on was extremely slow.  She would really like to go quite a bit  faster than that, and certainly I did indicate that we can bring her  down over the course of a week's time, in conjunction with Phenergan 25  mg daily and clonidine 0.1 mg twice daily, to help stabilize her  postoperative symptoms.  She should continue her Cymbalta.  I have  emphasized the need to continue acupuncture for her facial pain, and  that she will likely be needing visits at least monthly, when she gets  back to a better level with this.  She will continue seeing Mr. Philis Nettle for  this.   I have written a schedule where she will be taking eight hydrocodone per  day for one day, six x1 day, four x1 day, three x1 day, two x1 day and  one x1 day.   FOLLOWUP:  I will see her back next week.  We also discussed other  treatment alternatives, including a sphenopalatine ganglion block and  she will consider this if the acupuncture is not helping her.   Thank you very much for this interesting consultation.  I will keep you  appraised of her progress.   I also discussed alternative non-narcotic medications including  tramadol, which she states she has tried in the past, but really did not  have  any success with.      Erick Colace, M.D.  Electronically Signed     AEK/MedQ  D:  11/12/2007 16:08:06  T:  11/13/2007 11:12:45  Job #:  [id number]   cc:   Otilio Connors. Gerri Spore, M.D.  Fax: [phone]   Feliberto Gottron. Turner Daniels, M.D.  Fax: [phone]

## 2011-02-21 NOTE — Assessment & Plan Note (Signed)
Brittney [name] has chronic postoperative pain status post right total knee  replacement.  She has been able to reduce her Duragesic patch from 50  mcg to 37 mcg in the last month.  She feels like she is ready to wean  down a bit more.  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  She is walking 20 to 30 minutes at a time.  She  is climbing steps.  She is driving.  She has review of systems positive  for depression.  She has suicidal thoughts in the past but really no  plans.  She does see a psychologist actually, actually switched  psychologist lately.  She has numbness and tingling over the lateral  right knee and night sweats as well.  She is starting to have some  increasing right wrist pain again.  She had some good results from  carpal tunnel injection done approximately 2 months ago; however,  symptoms are recurring.  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   Review of her last EMG/NCV, which was performed on April 16, 2008,  demonstrated median nerve motor latency of 5.4 with some reduced  amplitude of 2.3.  She had a normal right ulnar motor study.  Right  median sensory is 5.0 with a normal ulnar sensory nerve distal latency  of 3.4.  Needle EMG is normal in the upper extremity.   PHYSICAL EXAMINATION:  EXTREMITIES:  Knee has good flexion.  She has  mild tenderness just proximal to the fibular head in the right lateral  knee area, negative popliteal fossa, mass, no evidence of effusion.  No  erythema.  Good healing of postoperative scar.  Right ankle has good  range of motion.  Hip has good range of motion.   IMPRESSION:  1. Chronic postoperative pain status post total knee, right side, 6      months ago.  We will continue weaning down on Duragesic down to 25      mcg next 2 weeks and go down to 12 mcg the following 2 weeks.  2. I will see her back in about a month.   At that time, we will be      able to switch to oral agents.  I will anticipate using either      hydrocodone or oxycodone at that point.   __________ to hydrocodone 5-10 mg b.i.d. to t.i.d.      Erick Colace, M.D.  Electronically Signed     AEK/MedQ  D:  06/30/2008 13:03:11  T:  07/01/2008 05:05:10  Job #:  [id number]   cc:   Feliberto Gottron. Turner Daniels, M.D.  Fax: [phone]   [name], M.D.  Fax: [phone]

## 2011-02-21 NOTE — Assessment & Plan Note (Signed)
Brittney [name] returns today.  She last saw me on August 27, 2008.  She  has had good results with right upper extremity pain.  She has had  followup treatment with Dr. Ollen Gross in regards to her chronic  postoperative total knee replacement pain.  She is considering surgical  revision for an overhanging tibial plateau component, which may be  causing an iliotibial band syndrome.  She has activity related pain that  is uncontrolled.  Her baseline pain is controlled on the Duragesic 37  mcg.   She has had problems in the past in terms of regulating hydrocodone  dosages.  She has also taken excessive Percocet in the past.  She has  with close monitoring been able to control at least over the short-term  oral analgesic usage.  She has had no problems with the Duragesic other  than when she is having breakthrough pain for a few days she put on an  extra 12 mcg patch, which I told her was not done with against my  medical advice.   Average pain is in the 5-6 range.  Interferes with activity at a  moderate level.  She walks without a cane most of the time, sometimes  uses a cane.  She climbs steps and drives.  She states that she does not  exercise anymore because of the knee pain.   REVIEW OF SYSTEMS:  Positive for depression, anxiety, night sweats, and  constipation.   She currently sees an acupuncturist as well as a [place].   CURRENT MEDICATIONS:  Duragesic 37 mcg q.72 h. in addition to nabumetone  500 mg b.i.d. with food.  She is on Midrin, Wellbutrin, cyclobenzaprine,  Lexapro, and zolpidem as well as hormone replacement therapy and  lorazepam, which she states is only for short-term use.   PHYSICAL EXAMINATION:  Her knee shows no evidence of effusion.  She has  some tenderness over the lateral joint line on the right side only.  She  has no lower extremity swelling.  No erythema in the knee or in the  lower extremity.  No hypersensitivity to touch.  She has good  strength  in the quad, ankle dorsiflexor, plantar flexor.  She has good hip range  of motion as well as knee and ankle range of motion bilaterally.   IMPRESSION:  Chronic postoperative knee pain post right total knee  replacement.  She has significant breakthrough pain limiting her  activities of daily living.   She has been in the past noncompliant with prescribed dosages of oral  medications.  I do think we need to give her a trial of breakthrough  medicine, but keep her monitored closely on every 2-week basis.  I will  give her 30 hydrocodone 10/325, to be taken twice a day, which will be a  2-week supply.  This is an addition to her Duragesic patch, which is a  total of 37 mcg.  I will see her back in 2 weeks, monitor her compliance  and we will likely need to do every 2-week either nursing or MD visits  or a combination.   This has been discussed with the patient and she agrees with the plan.  Should she fail to comply with this regimen, this will result in  discharge or nonnarcotic treatment only or resumption of simply  Duragesic dosage depending on specific compliant issues.      Erick Colace, M.D.  Electronically Signed     AEK/MedQ  D:  09/21/2008  16:25:35  T:  09/22/2008 07:42:08  Job #:  [id number]   cc:   Ollen Gross, M.D.  Fax: [phone]   Katy Fitch. Sypher, M.D.  Fax: [phone]
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
## 2011-02-21 NOTE — Op Note (Signed)
Brittney [name], Brittney [name]            ACCOUNT NO.:  [id number]   MEDICAL RECORD NO.:  [id number]          PATIENT TYPE:  INP   LOCATION:  2899                         FACILITY:  [facility]   PHYSICIAN:  Feliberto Gottron. Turner Daniels, M.D.   DATE OF BIRTH:  Jul 19, 1956   DATE OF PROCEDURE:  12/30/2007  DATE OF DISCHARGE:                               OPERATIVE REPORT   PREOPERATIVE DIAGNOSIS:  End-stage arthritis right knee.   POSTOPERATIVE DIAGNOSIS:  End-stage arthritis right knee.   PROCEDURE:  Right total knee arthroplasty, using DePuy Sigma RP  components cemented, 3 femur, 3 tibia, 35 mm patellar button, and a 10  mm Sigma RP spacer.   SURGEON:  Feliberto Gottron.  Turner Daniels, MD   FIRST ASSISTANT:  Laural Benes. Almira Bar.   ANESTHETIC:  General endotracheal.   ESTIMATED BLOOD LOSS:  Minimal.   FLUID REPLACEMENT:  1200 mL crystalloid.   TOURNIQUET TIME:  1 hour.   DRAINS PLACED:  A Foley catheter, and two medium Hemovac.   INDICATIONS FOR PROCEDURE:  A 55 year old woman whom I have followed for  many years for arthritis of the right knee.  She had a couple of knee  arthroscopies and unfortunately has had increasing pain.  The last  arthroscopy did show some bare-bone changes to the medial femoral  condyle and medial tibial plateau, and she has failed conservative  treatment with anti-inflammatory medicines, narcotics, including pain  management, antiinflammatory medications, cortisone injections, as well  as viscosupplementation.  Because of severe persistent disabling pain,  she desires elective right total knee arthroplasty.  Risks and benefits  of surgery were discussed, questions answered.   DESCRIPTION OF PROCEDURE:  The patient was identified by armband and in  the block area received a right femoral nerve block at [facility].  She was then taken to operating room #1, where the  appropriate anesthetic monitors were reattached, and general LMA  anesthesia induced with the patient in the  supine position.  She  received 1 g of vancomycin preoperatively.  Tourniquet was applied high  to the right thigh, and the right lower extremity prepped and draped in  the usual sterile fashion from the ankle to the tourniquet.  A time-out  was then performed.  The limb was wrapped with an Esmarch bandage, the  tourniquet inflated to 350 mmHg, and then the anterior midline incision  about 15 cm in length centered over the patella was made through the  skin and subcutaneous tissue down to the level of the transverse  retinaculum over the patella, which was then incised and reflected  medially.  This allowed a medial parapatellar arthrotomy.  The patella  was everted, and the prepatellar fat pad was resected.  The superficial  medial collateral ligament was then elevated off the anterior aspect of  the tibia, going towards the posteromedial corner, and leaving it intact  distally on the tibia about 6 cm down the metaphysis.  The knee was then  flexed and externally rotated.  Osteophytes were removed from the notch.  The ACL and the PCL were resected.  The knee was then hyperflexed  further and the tibia translated anteriorly with external rotation.  We  placed a posteromedial Z retractor and McHale retractor through the  notch and a lateral Hohmann retractor.  We then entered the proximal  tibia with the DePuy step drill, followed by the intramedullary rod set  for a 0-degree cutting guide, which was then pinned into place, allowing  resection of about 8 mm of bone medially and 10 mm of bone and cartilage  laterally.  Satisfied with the proximal tibial resection, we then  entered the distal femur 2 mm anterior to the PCL origin with a step  drill, followed by the IM rod and a 5-degree right distal femoral  cutting guide set for an 11 mm cut.  This was pinned along the  epicondylar axis and the distal femoral cut accomplished.  We then sized  for a #3 right femoral component using the  posterior sizing guide which  was pinned in place in neutral rotation.  The chamfer cutting guide was  then hammered into place using those holes and held with clips.  Anterior, posterior, and chamfer cuts were then accomplished, followed  by the Sigma RP box cut. The patella was measured at 22 mm, felt to fit  a 35 button.  The cutting guide was set at 15, and the posterior 7-8 mm  of the patella resected.  We sized for a 35 patellar button and drilled  the lollipop.  The knee was once again hyperflexed, exposing the  proximal tibia, which was then sized to a #3 tibial baseplate, which was  held in place with pins.  The smokestack was then hammered on top of the  baseplate and the conical ream accomplished.  The delta fin keel cuts  were then opened with a small oscillating saw, and the bullet and the  Delta fin keel were hammered into place.  A #3 right femoral component  trial was then hammered onto the femur and the lugs drilled.  A 10 mm  trial bearing was then placed on the tibial baseplate and the knee  reduced.  She came to full extension and had good ligamentous stability  and easily flexed to 135 degrees. The trial button on the patella  tracked with no thumb pressure whatsoever.  At this point, the trial  components were removed, and the bony surfaces were waterpiked clean and  dried with suction and sponges.  A double batch of DePuy HV cement with  1500 mg of Zinacef was then mixed and applied to all bony and metallic  mating surfaces, except for the posterior condyles of the femur itself.  In order, we hammered into place a #3 tibial baseplate and removed  excess cement, a #3 right femoral component and removed excess cement,  and the 35 mm patellar button was held in place with a clamp and excess  cement removed.  The 10 mm Sigma RP bearing was then placed on the  tibial baseplate, the knee reduced, brought to full extension, and held  in 5 degrees of flexion with pressure as  the cement cured.  The wound  was then waterpiked clean and dried once again with suction.  Medium  Hemovac drains were placed deep in the wound.  We closed the  parapatellar arthrotomy with running #1 Vicryl suture, the subcutaneous  tissue with running 0-0 and 2-0 undyed Vicryl suture, and the skin with  skin staples.  A dressing of Xeroform, 4x4 dressing sponges, Webril, and  an Ace wrap was  then applied.  The tourniquet was let down.  The patient  was awakened and taken to the recovery room without difficulty.      Feliberto Gottron. Turner Daniels, M.D.  Electronically Signed     FJR/MEDQ  D:  12/30/2007  T:  12/30/2007  Job:  [id number]

## 2011-02-21 NOTE — Assessment & Plan Note (Signed)
HISTORY OF PRESENT ILLNESS:  Brittney [name] returns today.  She has had 2  main problems come up since I last saw her on April 16, 2008.  She has had  swelling in hands and feet.  This seems to be worse in the feet towards  the end of the day and the hands when she first wakes up in the morning.  This has started after upping Lyrica from 50 mg to 75 mg.  She did call  and was advised to stop the Lyrica and has not had it for a day, but  still having some swelling.  Denies any shortness of breath.  She has  gained a little weight with this.   In addition, she is having a right hand pain in the median nerve  distribution, waking her up at night.  She had an EMG last visit, which  I discussed with her and did demonstrate right carpal tunnel syndrome  affecting median and sensory nerves.  Her knee pain overall was somewhat  better and really feels like her around-the-clock pain is not quite as  bad.  She does have some episodes of pain during the day, which are more  painful.  She states that the increased dose of the Duragesic was more  helpful for her pain, but also excessively sedating.   Her pain does interfere with activity at a 7 to 8 out of 10 level.   REVIEW OF SYSTEMS:  Numbness and tingling in the hand, had some  dizziness, particularly when she was on Lyrica and the higher dose  Duragesic.  She was able to put on a 50 mcg patch, which she had left  over from prior prescription.  She marks down suicidal thoughts, but  this is really more of a passive ideation, better of dead than have all  this pain, rather than an active plan.   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   PHYSICAL EXAMINATION:  VITAL SIGNS:  Her blood pressure is 118/74, pulse  78, respirations 20, and O2 sat 96% on room air.  GENERAL:  She has edema, bilateral legs.  She is in no acute distress,  no respiratory distress.  NEUROLOGIC:  Orientation x3.  Affect is alert.  Gait is normal.  EXTREMITIES:  She has 2-3+ edema at the mid calf and below, trace edema  in the upper extremities.  She has positive Tinel's in the right hand.  She has good grip on the left, but 4/5 grip on the right.  She has  normal biceps, triceps, and deltoid bilaterally.  SKIN:  She has some cracks and dried skin, bilateral upper extremities.  No erythema or joint swelling.   IMPRESSION:  1. Chronic lower extremity pain, right lower extremity, status post      total knee replacement.  2. 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  3. Peripheral edema due to Lyrica.   PLAN:  1. Given that her pain is less constant, we will reduce her Duragesic      patch to 25 mcg and give her Percocet 10/325 mg to take t.i.d. for      breakthrough pain.  She was given 2 weeks supply of each      medication.  I will actually see her back in about 10-14 days.  She      knows that she will not get any replacement medications if she runs      out early, particularly on the Percocet.  2. Carpal tunnel injection today.  3. Discontinue Lyrica.  At this point, we will not trial another      adjuvant medication.      Brittney [name], M.D.  Electronically Signed     AEK/MedQ  D:  04/24/2008 16:41:24  T:  04/25/2008 07:09:29  Job #:  [id number]   cc:   [name], M.D.  Fax: [phone]

## 2011-02-21 NOTE — Assessment & Plan Note (Signed)
Date of last visit on May 04, 2008.   Brittney [name] is doing better in terms of her knee pain.  She has been  doing her home exercise program again.  She has been doing walking  outside.  Her right hand still has occasional pain into the middle  digits, but is mainly just with the hand flexion.  She is not having any  nighttime pain.  She uses nighttime splints.   She would like to reduce her Duragesic again.  She still feels a bit  fuzzy headed.   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  She has not  worked for almost 2 years now.   Her pain scores are in the 4-5 range, compared to 5-6 last time.  Her  pain with activities is at 5/10 level.  Pain is worse with walking and  standing, improves with exercise and medications.  She can walk 20  minutes at a time.   REVIEW OF SYSTEMS:  Positive for depression and constipation.   PHYSICAL EXAMINATION:  VITAL SIGNS:  Her blood pressure is 103/63, pulse  67, respirations 18, and O2 sat 98% on room air.  GENERAL:  In no acute distress.  Mood and affect are appropriate.  Her  knee has about 112 degrees of flexion on the right side and 120 on the  left.  She states that in therapy, she has had higher numbers, but this  is after warm ups and stretching.   She has had no new medical problems in the interval time.   She has no hypersensitivity to touch over the knee.  She has some mild  effusion on the right knee and the surgical scar is healing well.  Wrist  has a mild Tinel's on the right side.  Her lower extremity strength is  normal.   IMPRESSION:  Chronic postoperative pain, status post total knee done  about 5 months ago.   PLAN:  1. We will continue weaning down on Duragesic, go down to 37 mcg this      month.  2. Right carpal tunnel syndrome, improved after injection.  We will      monitor, if she has recurrence, we will decide whether we can      reinject versus referal to Hand Surgery.   I will reinstitute  Lidoderm over the lateral knee area on the right  side.      Erick Colace, M.D.  Electronically Signed     AEK/MedQ  D:  06/01/2008 09:02:25  T:  06/01/2008 22:22:54  Job #:  [id number]   cc:   [name], M.D.  Fax: [phone]   Feliberto Gottron. Turner Daniels, M.D.  Fax: [phone]

## 2011-02-21 NOTE — H&P (Signed)
Brittney [name], Brittney [name]            ACCOUNT NO.:  [id number]   MEDICAL RECORD NO.:  [id number]          PATIENT TYPE:  INP   LOCATION:  NA                           FACILITY:  [facility]   PHYSICIAN:  Ollen Gross, M.D.    DATE OF BIRTH:  Jan 27, 1956   DATE OF ADMISSION:  02/08/2009  DATE OF DISCHARGE:                              HISTORY & PHYSICAL   CHIEF COMPLAINT:  Painful left total knee.   HISTORY OF PRESENT ILLNESS:  The patient is a 55 year old female who has  seen in consultation for second opinion by Dr. Lequita Halt concerning her  right total knee.  She underwent a total knee back in March 2009.  Initially did pretty well for the first 6-7 weeks, but started  developing some sharp stabbing pain and lateral-sided pain.  It is at a  point where she has been hurting all the time.  She has been on chronic  pain medicines and pain management.  She has been on Duragesic Patch  also for some time now.  She has been trying to come off of this and  will be off of it several weeks prior to her surgery.  She has been seen  and found to have unfortunately some early loosening, especially in the  tibial side.  There is some concern of some IT band irritation and does  have a little bit of instability.  Due to the instability and loosening,  it is felt she would best be served by undergoing a revision procedure  with revision of the tibial tray and possible full revision.  She has  had some workup in the past.  Her sed rate was 12 and C-reactive protein  2.5, both within normal range.  It is felt that it is probably  noninfectious in nature, but we will also reevaluate it at time of  surgery.   ALLERGIES:  1. KEFLEX.  2. LYRICA.  3. NSAIDS including ASPIRIN, CODEINE and DARVOCET.   CURRENT MEDICATIONS:  1. Wellbutrin 200 SR twice a day.  2. Lexapro 20 mg daily.  3. Celebrex 200 mg every other day approximately 3 times a week.  4. Midrin p.r.n.  5. Femhrt 0.5/2.5 daily.   PAST MEDICAL  HISTORY:  1. Migraines.  2. Past history of bronchitis once in 2000.  3. Depression.  4. Postmenopausal.  5. Childhood illnesses and measles.   PAST SURGICAL HISTORY:  1. Sinus surgery August 2007.  2. Septoplasty May 2007.  3. Tonsils out around 1960.  4. Knee scope in 1973.  5. Pilonidal cyst in 1977.  6. Left hand nerve exploratory surgery in 1989.  7. Right wrist scope 1996.  8. Left ankle and foot scope 1998.  9. Right knee scope September 2008 and again in November 2008.  10.Total knee replacement March 2009.  11.Right hand carpal tunnel surgery November 2009.   SOCIAL HISTORY:  Single, works as a Environmental manager.  Nonsmoker.  No  alcohol.  Her partner, Fawn Kirk,  will be assisting with care after  surgery.  She does have steps entering her home.  She does have a living  will  and healthcare power of attorney.   REVIEW OF SYSTEMS:  GENERAL:  No fevers, chills, occasional night  sweats.  NEURO:  She does have migraine headaches.  No seizures, syncope  or paralysis.  RESPIRATORY:  No shortness of breath, productive cough or  hemoptysis.  CARDIOVASCULAR:  No chest pain, angina or orthopnea.  GI:  She has had some constipation due to a chronic pain medicines.  No  nausea, vomiting or diarrhea.  GU:  Little bit of nocturia.  No dysuria,  hematuria.  MUSCULOSKELETAL:  Right knee.   PHYSICAL EXAMINATION:  VITAL SIGNS:  Pulse 76, respirations 14, blood  pressure 108/70.  GENERAL:  A 55 year old white female well-nourished, well-developed in  no acute distress.  She is alert, oriented and cooperative.  HEENT:  Normocephalic, atraumatic.  Pupils are round and reactive.  EOMs  intact.  NECK:  Supple.  CHEST:  Clear anterior and posterior chest walls.  No rhonchi, rales,  wheezing.  HEART:  Regular rate and rhythm.  No murmur, S1 and S2 noted.  ABDOMEN:  Soft, nontender.  Bowel sounds are present.  RECTAL/BREASTS/GENITALIA:  Not done and not pertinent to present  illness.   EXTREMITIES:  No swelling.  No warmth.  Range of motion 0-120.  No  obvious instability on exam.   IMPRESSION:  Painful right total knee.   PLAN:  The patient was admitted to [facility] to undergo  revision total knee procedure.  Surgery is to be performed by Dr. Ollen Gross.      Brittney [name], P.A.C.      Ollen Gross, M.D.  Electronically Signed    ALP/MEDQ  D:  02/04/2009  T:  02/04/2009  Job:  [id number]   cc:   Ollen Gross, M.D.  Fax: [phone]   Erick Colace, M.D.  Fax: [phone]   Carola J. Gerri Spore, M.D.  Fax: [phone]

## 2011-02-21 NOTE — Assessment & Plan Note (Signed)
HISTORY:  Brittney [name] has chronic postoperative pain status post right  total knee replacement performed in March 2009.  Last time I saw her on  July 28, 2008, she was doing okay on 30 mg of hydrocodone per day.  In the interval time period, she states that she did something stupid,  where she started carrying bags of topsoil around her yard, which really  flared up her knee pain.  She has had no falls.  She has had no knee  effusion.  She plans to follow up with Dr. Lequita Halt for a third opinion  on her knee tomorrow.   In the interval time period, she has also decided that she will undergo  right carpel tunnel release per Dr. Teressa Senter.  Last time I saw her, she  actually states that she was doing quite well in terms of her wrist and  hand pain after a carpel tunnel injection.   Her average pain is 5-6/10, current pain is 3.  Pain is described as  sharp, stabbing, in the knee and radiating down the lateral leg.   Of note is she has had an MRI of the lumbar spine showing no compressive  lesion to affect the right lower extremity.  In addition, she has had  nerve conduction study of the right lower extremities showing no  evidence of neuropathy.   Her Oswestry disability index performed today is 40%, which is stable  compared to last visit at 36%.   REVIEW OF SYSTEMS:  Positive for depression and history of suicidal  thoughts, no active plan.  She has been seeing her counselor, who has  been encouraging her to get back into exercise.  Her review of systems  is positive for constipation.   PHYSICAL EXAMINATION:  GENERAL:  Well-developed, well-nourished female  in no acute distress.  Orientation is x3.  Gait is normal.  She is able  to toe walk and heel walk.  EXTREMITIES:  Without edema.  She has no evidence of effusion in right  knee.  No erythema.  She has some tenderness to palpation in the lateral  aspect of the joint line on the right side only.  No pain over the  hamstring  bursa.  She has normal strength in the lower extremity.  Good  hip and ankle range of motion.   IMPRESSION:  Chronic postoperative pain.  She has had an exacerbation of  her pain.  It appears to be in a location consistent with tensor fascia  lata insertion.  She did pain in fact going down from her hip down to  her knee when she was experiencing more acute pain.  She has had  problems with this in the past.   PLAN:  1. She will see a Dr. Lequita Halt tomorrow.  I would like him to send me a      copy of his note.  2. In terms of her narcotic pain management, at the patient's request,      she feels that she cannot regulate her hydrocodone very well,      started taking too many, then ran out after this exacerbation of      pain.  Therefore, we will switch her back to her Duragesic patch 37      mcg q.72 h.  3. In regards to her postoperative carpel tunnel surgery pain      management, she will have Dr. Teressa Senter prescribe Percocet, which will      be on top of  her Duragesic.  4. I will see her back in 3 weeks.  5. The patient is in agreement with this plan.      Erick Colace, M.D.  Electronically Signed     AEK/MedQ  D:  08/06/2008 15:49:47  T:  08/07/2008 01:57:56  Job #:  [id number]   cc:   Ollen Gross, M.D.  Fax: [phone]   Feliberto Gottron. Turner Daniels, M.D.  Fax: [phone]   [name], M.D.  Fax: [phone]   Katy Fitch. Sypher, M.D.  Fax: [phone]   Carola J. Gerri Spore, M.D.  Fax: [phone]

## 2011-02-21 NOTE — Assessment & Plan Note (Signed)
The patient returns today.  She was seen by me approximately 2 weeks  ago.  She is here for chronic postoperative knee pain.  She has seen Dr.  Antony Odea, scheduled for surgery Feb 08, 2009, for revision.  Her average  pain is 5-6/10, currently 3/10.  Pain does interfere with household  duties.   REVIEW OF SYSTEMS:  Positive for trouble walking, depression, anxiety,  constipation.  Negative for suicidal thoughts.   Her blood pressure 113/71, pulse 84, respirations 18, O2 sat 99% on room  air.   Pill counts were appropriate today.   Examination of her right knee shows no evidence of effusion.  She has  good range of motion and strength.  She does have tenderness over the  lateral aspect of the tibial plateau.   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  She is scheduled for revision surgery.  In the meantime, she  would like to be weaned off narcotic analgesics preop to simplify her  postoperative pain management.  To that effect, we will take her off her  fentanyl patch.  She is on her last one today.  We will increase her  hydrocodone 7.5/325 from t.i.d. to q.i.d. per 2 weeks.  I will see her  back in 2 weeks and then wean down to t.i.d. at that time.      Erick Colace, M.D.  Electronically Signed     AEK/MedQ  D:  11/26/2008 16:06:20  T:  11/27/2008 04:05:34  Job #:  [id number]

## 2011-02-21 NOTE — Procedures (Signed)
Brittney [name], Brittney [name]            ACCOUNT NO.:  [id number]   MEDICAL RECORD NO.:  [id number]          PATIENT TYPE:  REC   LOCATION:  TPC                          FACILITY:  [facility]   PHYSICIAN:  Erick Colace, M.D.DATE OF BIRTH:  December 05, 1955   DATE OF PROCEDURE:  04/24/2008  DATE OF DISCHARGE:                               OPERATIVE REPORT   This is right carpal tunnel injection.   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  Pain  persists despite bracing at night and wakes her up at night.   Informed consent obtained after describing risks and benefits of the  procedure with the patient.  These include bleeding, bruising, and  infection.  She elects to proceed and has given written consent.  The  patient in a seated position, area at the distal wrist crease marked and  prepped with Betadine and alcohol entered with a 27-gauge 5/8-inch  needle with infiltration of 1% lidocaine x 0.25 mL in the skin and  subcutaneous tissues, followed by another 5/8-inch 27-gauge needle  inserted and injecting 0.25 mL of 40 mg/mL Depo-Medrol.  The patient  tolerated the procedure well.  Post-injection instructions given.      Erick Colace, M.D.  Electronically Signed     AEK/MEDQ  D:  04/24/2008 16:35:00  T:  04/25/2008 03:45:55  Job:  [id number]

## 2011-02-21 NOTE — Assessment & Plan Note (Signed)
Brittney [name] returns today.  She is a 55 year old female who I saw in  initial consultation for facial pain back in February 2009.  This  actually resolved after some acupuncture treatment.  She has undergone  right total knee replacement, initially with good postoperative  recovery; however, starting about 7 weeks postop, having increasing pain  during physical therapy just below her knee.  Knee x-rays were rechecked  per Orthopedics, and these looked okay.   She had a lumbar MRI looking for any signs of radiculopathy, and  certainly, there was no evidence of this.  There was no evidence of any  compressive lesions in the lumbar spine.  In fact, it looked quite well,  other than some facet arthropathy.  No neural foraminal narrowing.   We tried tapering her down on her Percocet, and really, she did not  follow through on my tapering schedule.  She really just stayed at her 8  tablets per day, ran out yesterday and borrowed some hydrocodone from  her partner.  I cautioned her against this and stated we need to get  her off pills and that any further borrowing of pills would result in  discharge.  Her average pain is 4-5/10.  Pain is currently 3/10.  Pain  is interfering with her activity at 6/10.  Sleep is poor.  Relief from  meds is fair.  She is able to climb steps, but slowly.  She drives.  She  is independent with all of her self-care.  She is a Environmental manager and  self-employed.   REVIEW OF SYSTEMS:  Positive for numbness, tingling, trouble walking,  spasms, depression, and constipation.   PHYSICAL EXAMINATION:  Blood pressure is 130/75, pulse 68, respirations  18, and O2 sat is 97% on room air.  No acute distress.  Her mood and  affect are emotionally labile, cries.  Orientation x3.  Gait is normal.  She is able to toe walk and heel walk, although this causes some pain in  the back of her knee.  Her knee shows no evidence of swelling.  Her  incision is healing well.  She has no  vasomotor changes in the lower  extremities.  She has normal pulses in the bilateral lower extremities.  She has good range of motion in the knees, hips, and ankles.  Lumbar  spine range of motion is good.  Sensation is mildly diminished to  pinprick in the lateral aspect of the left leg.   IMPRESSION:  Chronic postoperative pain of undetermined etiology.  At  this point, she has some paresthesias on the lateral leg on the right  side and somewhat on the dorsum of the foot, question whether she could  have a peroneal neuropathy, although I am not sure as to the mechanism.  She thinks that somehow it got worse during physical therapy.   Certainly, the differential would be reflex sympathetic dystrophy, but  she does not have any other symptoms other than pain and even so is not  significantly hyperalgesic or have any significant allodynia over the  area.  Certainly, no vasomotor changes.   PLAN:  We will start out with EMG, NCV right lower extremity.  If it is  negative, consider a bone scanning.  She is looking for orthopedic  second opinion, and is in the process of doing this.   I will see her back with the EMG.   In terms of marked narcotic analgesic misuse, I will discontinue  Percocet, start her on  fentanyl patch 50 mcg q.72 h., and see her back  in 2 weeks.  Gave her a 2-week supply.      Erick Colace, M.D.  Electronically Signed     AEK/MedQ  D:  03/23/2008 16:07:22  T:  03/24/2008 11:13:20  Job #:  [id number]   cc:   Feliberto Gottron. Turner Daniels, M.D.  Fax: [phone]   Ollen Gross, M.D.  Fax: [phone]   Carola J. Gerri Spore, M.D.  Fax: [phone]

## 2011-02-27 ENCOUNTER — Ambulatory Visit: Payer: BC Managed Care – PPO | Admitting: Physical Medicine & Rehabilitation

## 2011-03-03 ENCOUNTER — Ambulatory Visit (HOSPITAL_BASED_OUTPATIENT_CLINIC_OR_DEPARTMENT_OTHER): Payer: BC Managed Care – PPO | Admitting: Physical Medicine & Rehabilitation

## 2011-03-03 DIAGNOSIS — G500 Trigeminal neuralgia: Secondary | ICD-10-CM

## 2011-03-04 NOTE — Procedures (Signed)
NAMELEOTTA, WEINGARTEN            ACCOUNT NO.:  [id number]  MEDICAL RECORD NO.:  [id number]           PATIENT TYPE:  O  LOCATION:  [facility]                          FACILITY:  [facility]  PHYSICIAN:  Erick Colace, M.D.DATE OF BIRTH:  [birth year]  DATE OF PROCEDURE:  03/03/2011 DATE OF DISCHARGE:                              OPERATIVE REPORT  REASON FOR VISIT:  Right sphenopalatine ganglion block.  INDICATION:  Trigeminal neuralgia, right facial.  The patient's last procedure performed on Feb 17, 2011, had very good relief for 10 days, now worn off again.  Informed consent was obtained after describing risks and benefits of the procedure.  These include bleeding, bruising, and infection.  She elects to proceed and has given written consent.  The patient was placed in reclined position with the swabs soaked in 0.1 mL of 4% cocaine solution inserted into the right nares x2, additional 0.4 mL 4% cocaine solution dripped along the swabs into the nares on the right side.  The patient tolerated the procedure well.  Treatment time 30 minutes.  Return in 2 weeks for repeat treatment.  If we are not able to get a longer duration of effect, may need to consider referral for trigeminal RF.  We will continue lidocaine spray, instructed in proper usage.     Erick Colace, M.D. Electronically Signed    AEK/MEDQ  D:  03/03/2011 14:44:42  T:  03/04/2011 01:51:42  Job:  [id number]

## 2011-03-20 ENCOUNTER — Ambulatory Visit (HOSPITAL_BASED_OUTPATIENT_CLINIC_OR_DEPARTMENT_OTHER): Payer: BC Managed Care – PPO | Admitting: Physical Medicine & Rehabilitation

## 2011-03-20 ENCOUNTER — Encounter: Payer: BC Managed Care – PPO | Attending: Physical Medicine & Rehabilitation

## 2011-03-20 DIAGNOSIS — G500 Trigeminal neuralgia: Secondary | ICD-10-CM

## 2011-03-20 DIAGNOSIS — IMO0002 Reserved for concepts with insufficient information to code with codable children: Secondary | ICD-10-CM | POA: Insufficient documentation

## 2011-03-21 NOTE — Procedures (Signed)
NAMEALEXANDRA, Brittney [name]            ACCOUNT NO.:  [id number]  MEDICAL RECORD NO.:  [id number]           PATIENT TYPE:  O  LOCATION:  [facility]                          FACILITY:  [facility]  PHYSICIAN:  Erick Colace, M.D.DATE OF BIRTH:  1956/08/20  DATE OF PROCEDURE: DATE OF DISCHARGE:                              OPERATIVE REPORT  PROCEDURE:  Sphenopalatine ganglion block, right side.  INDICATION:  Trigeminal neuralgia.  Pain is only partially responsive to medication management and other conservative care.  Informed consent was obtained after describing risks and benefits of the procedure with the patient.  These include bleeding, bruising and infection.  She elects to proceed and has given written consent.  The patient placed on a supine position, area marked, and pledgets inserted after being soaked in 4% cocaine solution 0.1 mL.  An additional 0.3 mL of cocaine 4% were instilled through the nares on the right side.  A 30- minute treatment time.  The patient tolerated the procedure well. Postprocedure instructions given.     Erick Colace, M.D. Electronically Signed    AEK/MEDQ  D:  03/20/2011 11:33:48  T:  03/21/2011 01:22:02  Job:  [id number]

## 2011-03-27 ENCOUNTER — Encounter (HOSPITAL_BASED_OUTPATIENT_CLINIC_OR_DEPARTMENT_OTHER): Payer: BC Managed Care – PPO | Admitting: Physical Medicine & Rehabilitation

## 2011-03-27 DIAGNOSIS — IMO0002 Reserved for concepts with insufficient information to code with codable children: Secondary | ICD-10-CM

## 2011-03-27 NOTE — Procedures (Signed)
Brittney [name], Brittney [name]            ACCOUNT NO.:  [id number]  MEDICAL RECORD NO.:  [id number]           PATIENT TYPE:  O  LOCATION:  [facility]                          FACILITY:  [facility]  PHYSICIAN:  Erick Colace, M.D.DATE OF BIRTH:  July 02, 1956  DATE OF PROCEDURE: DATE OF DISCHARGE:                              OPERATIVE REPORT  PROCEDURE:  This is right S1 transforaminal lumbar epidural steroid injection under fluoroscopic guidance.  INDICATION:  Lumbar radiculitis S1 dermatomal distribution.  Previous good relief with S1 transforaminal injection performed on January 09, 2011. Pain is only partially response to medication management including narcotic analgesics and interferes with activities.  Informed consent was obtained after describing risks and benefits of the procedure with the patient.  These include bleeding, bruising, and infection.  She elects to proceed and has given written consent.  The patient placed prone on fluoroscopy table.  Betadine prep, sterile drape, a 25-gauge inch and half needle was used to anesthetize the skin and subcutaneous tissue with 1% lidocaine x2 mL.  Then a 22-gauge 3-1/2 inch spinal needle was inserted under fluoroscopic guidance targeting right S1 foramen.  AP lateral and oblique images utilized.  Omnipaque 180 under live fluoro demonstrated no intravascular uptake.  Good epidural spread followed by injection of 1 mL, 10 mg/mL dexamethasone and 2 mL of 1% MPF lidocaine.  The patient tolerated procedure well. Pre and post injection vitals stable.  Post injection instructions given.     Erick Colace, M.D. Electronically Signed    AEK/MEDQ  D:  03/27/2011 10:10:02  T:  03/27/2011 21:39:18  Job:  [id number]

## 2011-04-03 ENCOUNTER — Ambulatory Visit (HOSPITAL_BASED_OUTPATIENT_CLINIC_OR_DEPARTMENT_OTHER): Payer: BC Managed Care – PPO | Admitting: Physical Medicine & Rehabilitation

## 2011-04-03 DIAGNOSIS — G500 Trigeminal neuralgia: Secondary | ICD-10-CM

## 2011-04-04 NOTE — Procedures (Signed)
NAMECRISTY, COLMENARES            ACCOUNT NO.:  [id number]  MEDICAL RECORD NO.:  [id number]           PATIENT TYPE:  O  LOCATION:  [facility]                          FACILITY:  [facility]  PHYSICIAN:  Erick Colace, M.D.DATE OF BIRTH:  January 13, 1956  DATE OF PROCEDURE:  04/03/2011 DATE OF DISCHARGE:                              OPERATIVE REPORT  This is a right sphenopalatine ganglion block.  INDICATION:  Trigeminal neuralgia, right facial V1-V2.  Last procedure performed, March 20, 2011, good relief, now worn off.  Informed consent was obtained after describing risks and benefits of the procedure.  These include bleeding, bruising, and infection.  She elects to proceed and has given written consent.  The patient was placed in a reclined position.  Swabs soaked in 0.1 mL of 4% cocaine solution inserted into the right naris x2, additional 0.4 mL 4% cocaine solution dripped along the swabs into the naris on the right side.  The patient tolerated the procedure well.  Treatment time 30 minutes.  I will see her back in approximately 1 month.  We will look at other treatment options if no prolonged treatment effect.     Erick Colace, M.D. Electronically Signed    AEK/MEDQ  D:  04/03/2011 11:17:16  T:  04/04/2011 00:16:15  Job:  [id number]

## 2011-05-05 ENCOUNTER — Encounter: Payer: BC Managed Care – PPO | Attending: Physical Medicine & Rehabilitation

## 2011-05-05 ENCOUNTER — Ambulatory Visit (HOSPITAL_BASED_OUTPATIENT_CLINIC_OR_DEPARTMENT_OTHER): Payer: BC Managed Care – PPO | Admitting: Physical Medicine & Rehabilitation

## 2011-05-05 DIAGNOSIS — M549 Dorsalgia, unspecified: Secondary | ICD-10-CM | POA: Insufficient documentation

## 2011-05-05 DIAGNOSIS — M79609 Pain in unspecified limb: Secondary | ICD-10-CM | POA: Insufficient documentation

## 2011-05-05 DIAGNOSIS — G500 Trigeminal neuralgia: Secondary | ICD-10-CM

## 2011-05-05 DIAGNOSIS — M545 Low back pain: Secondary | ICD-10-CM

## 2011-05-06 NOTE — Assessment & Plan Note (Signed)
CHIEF COMPLAINT:  Right facial pain with secondary complaint of low back pain.  HISTORY:  A 55 year old female with history of trigeminal neuralgia affecting V1 and V2 branches.  She has trialed a variety of medications for this.  She has been on narcotic analgesics as well as adjuvant medications.  More recently, she has had sphenopalatine ganglion blocks with topical anesthetic which have provided about a 8-9 day relief of pain.  She was unable to tolerate Lyrica or Neurontin.  She trialed Nucynta which was also not particularly helpful.  She has been seen by Neurology at [facility] and seen by pain management, Dr. Thyra Breed as well.  She trialed oxcarbazepine with myself and once again the patient denies any particular relief and did not like the way it made her feel.  In terms of her low back pain, she has had both coccygeal low back as well as right lower extremity pain.  She has had right S1 transforaminal lumbar epidural steroid injection with fair relief with this.  Her lower extremity pain has improved.  She still has low back pain.  CURRENT MEDICATIONS: 1. Tramadol 50 t.i.d. 2. Robaxin 500 p.o. b.i.d. 3. She also takes intermittent hydrocodone 5/325 one or two tablets     per day.  Other medications include Lexapro, Wellbutrin, Carafate, Ambien, Flexeril and Dexilant.  INTERVAL MEDICAL HISTORY:  She went to [place] for vacation.  She had a flare-up of her low back pain that gave her a COX-2 inhibitor that is not available in the [place], but this helps her quite a bit.  She would like to discuss being on a nonsteroidal or COX-2.  In the past, it has been discontinued due to GI upset, but no history of peptic ulcer disease.  Pain score is 6-7 around the right eye and in the back 4-5/10.  Her review of systems positive for numbness, tingling, depression, anxiety, nausea and night sweats.  FUNCTIONAL STATUS:  Independent walking tolerance is about 60  minutes.  Her blood pressure 132/68, pulse 75, respirations 18 and O2 sat 99% on room air.  Her back has some tenderness to palpation.  She has full range of motion.  Around her right eye, she does have decreased sensation right V1-V2 dermatomal distribution.  There is no weakness of the mastoid muscle or in the cranial nerve VII muscles.  IMPRESSION: 1. Trigeminal neuralgia chronic failing conservative care.  We     discussed her treatment options.  I think she would benefit from a     trigeminal nerve block and I have referred her to Dr. Willaim Bane to do     this and also for him to recommend any other further treatments     that he thinks maybe helpful. 2. Low back pain.  Her radicular component is improved.  I will send     her for physical therapy for lumbar     stabilization program.  We will also start her on Celebrex 100 mg     per day taken with food, continue her Dexilant and discussed with     the patient, agrees with plan.  I will see her back in a month.     Erick Colace, M.D. Electronically Signed    AEK/MedQ D:  05/05/2011 15:05:36  T:  05/06/2011 00:06:07  Job #:  [id number]

## 2011-06-05 ENCOUNTER — Ambulatory Visit: Payer: BC Managed Care – PPO | Admitting: Physical Therapy

## 2011-06-06 ENCOUNTER — Ambulatory Visit (HOSPITAL_BASED_OUTPATIENT_CLINIC_OR_DEPARTMENT_OTHER): Payer: BC Managed Care – PPO | Admitting: Physical Medicine & Rehabilitation

## 2011-06-06 ENCOUNTER — Encounter: Payer: BC Managed Care – PPO | Attending: Physical Medicine & Rehabilitation

## 2011-06-06 DIAGNOSIS — IMO0002 Reserved for concepts with insufficient information to code with codable children: Secondary | ICD-10-CM

## 2011-06-06 DIAGNOSIS — G500 Trigeminal neuralgia: Secondary | ICD-10-CM

## 2011-06-06 DIAGNOSIS — R51 Headache: Secondary | ICD-10-CM | POA: Insufficient documentation

## 2011-06-06 DIAGNOSIS — M79609 Pain in unspecified limb: Secondary | ICD-10-CM | POA: Insufficient documentation

## 2011-06-07 NOTE — Assessment & Plan Note (Signed)
REASON FOR VISIT:  Facial pain as well as right lower extremity pain.  HISTORY:  A 55 year old female with multiple pain complaints.  She has had trigeminal neuralgia, trialed on multiple pain medications followed by a trial of sphenopalatine ganglion blocks.  She has had some partial relief with sphenopalatine block.  She has been referred to Dr. Odette Fraction, who trialed on her trigeminal nerve block.  First ones she had some flare-up of her pain which subsided after sphenopalatine blocks, then she has subsequent trigeminal injection under fluoroscopic guidance which was more helpful.  She is starting to get some pain down in her right lower extremity.  She has had good relief in the past with S1 transforaminal injections on the right side.  She has had no new problems in the interval time period since I last saw her.  She is continued to take tramadol 50 mg t.i.d. She takes Robaxin 500 b.i.d. and she has been given some oxycodone by Dr. Ollen Bowl following her procedure, had previously been on hydrocodone 5/325 one to two p.o. daily.  PHYSICAL EXAMINATION:  She has decreased dermatomal distribution, sensation over right L5, but she has decreased right S1 reflex. Remainder of reflex and sensation are intact in lower extremity.  She has a healed total knee arthroplasty scar on the right side.  From the patient's stand point, she has no evidence of droop.  She has hypersensitivity over the maxillary and V1 area.  VITAL SIGNS:  Blood pressure 112/52, pulse 74, respirations 18, and O2 sat 99% on room air.  IMPRESSION: 1. Trigeminal neuralgia.  She is undergoing treatment, may be needing     a trigeminal RF with Dr. Odette Fraction. 2. Right S1 radiculopathy.  I think it is reasonable for her to be     followed by Dr. Ollen Bowl for both these issues.  I discussed this     with her.  She is okay with it.  He can take over medications which     are fairly minimal at this  point.     Erick Colace, M.D. Electronically Signed    AEK/MedQ D:  06/06/2011 12:18:06  T:  06/06/2011 15:04:41  Job #:  [id number]  cc:   Colon Flattery. Ollen Bowl, M.D. Fax: [phone]

## 2011-07-03 LAB — BASIC METABOLIC PANEL
BUN: 4 — ABNORMAL LOW
Calcium: 8.7
GFR calc non Af Amer: >60
Glucose, Bld: 117 — ABNORMAL HIGH
Potassium: 3.7

## 2011-07-03 LAB — CBC
HCT: 38.3
Hemoglobin: 9.4 — ABNORMAL LOW
MCHC: 34.9
Platelets: 153
Platelets: 159
Platelets: 215
RBC: 2.87 — ABNORMAL LOW
RBC: 2.89 — ABNORMAL LOW
RDW: 12.3
RDW: 12.6
WBC: 5.1
WBC: 5.4
WBC: 6.1
WBC: 7.5

## 2011-07-03 LAB — PROTIME-INR
INR: 1.1
INR: 1.5
INR: 1.6 — ABNORMAL HIGH
INR: 1.9 — ABNORMAL HIGH
Prothrombin Time: 14.1
Prothrombin Time: 18.8 — ABNORMAL HIGH
Prothrombin Time: 19 — ABNORMAL HIGH
Prothrombin Time: 22.5 — ABNORMAL HIGH

## 2011-07-03 LAB — ABO/RH: ABO/RH(D): O POS

## 2011-07-03 LAB — COMPREHENSIVE METABOLIC PANEL
ALT: 28
AST: 27
Albumin: 4.3
Alkaline Phosphatase: 67
Chloride: 102
GFR calc Af Amer: >60
Potassium: 3.9
Sodium: 139
Total Bilirubin: 0.5
Total Protein: 6.4

## 2011-07-03 LAB — TYPE AND SCREEN: Antibody Screen: NEGATIVE

## 2011-07-03 LAB — APTT: aPTT: 45 — ABNORMAL HIGH

## 2011-07-03 LAB — DIFFERENTIAL
Basophils Absolute: 0
Basophils Relative: 1
Eosinophils Relative: 0
Monocytes Absolute: 0.4
Monocytes Relative: 7

## 2011-07-03 LAB — URINALYSIS, ROUTINE W REFLEX MICROSCOPIC
Bilirubin Urine: NEGATIVE
Glucose, UA: NEGATIVE
Ketones, ur: NEGATIVE
Nitrite: NEGATIVE
Specific Gravity, Urine: 1.019
pH: 5

## 2011-07-11 LAB — POCT HEMOGLOBIN-HEMACUE
Hemoglobin: 13.1
Hemoglobin: 14.1

## 2011-07-31 ENCOUNTER — Ambulatory Visit
Admission: RE | Admit: 2011-07-31 | Discharge: 2011-07-31 | Disposition: A | Discharge status: Home / Self Care | Payer: BC Managed Care – PPO | Source: Ambulatory Visit | Attending: Radiation Oncology | Admitting: Radiation Oncology

## 2011-07-31 DIAGNOSIS — G500 Trigeminal neuralgia: Secondary | ICD-10-CM | POA: Insufficient documentation

## 2011-07-31 DIAGNOSIS — Z79899 Other long term (current) drug therapy: Secondary | ICD-10-CM | POA: Insufficient documentation

## 2011-07-31 DIAGNOSIS — K219 Gastro-esophageal reflux disease without esophagitis: Secondary | ICD-10-CM | POA: Insufficient documentation

## 2011-07-31 DIAGNOSIS — R51 Headache: Secondary | ICD-10-CM | POA: Insufficient documentation

## 2011-08-15 ENCOUNTER — Other Ambulatory Visit: Payer: Self-pay | Admitting: Neurosurgery

## 2011-08-15 DIAGNOSIS — G500 Trigeminal neuralgia: Secondary | ICD-10-CM

## 2011-08-16 ENCOUNTER — Ambulatory Visit
Admission: RE | Admit: 2011-08-16 | Discharge: 2011-08-16 | Disposition: A | Discharge status: Home / Self Care | Payer: BC Managed Care – PPO | Source: Ambulatory Visit | Attending: Neurosurgery | Admitting: Neurosurgery

## 2011-08-16 DIAGNOSIS — G500 Trigeminal neuralgia: Secondary | ICD-10-CM

## 2011-09-15 ENCOUNTER — Encounter (HOSPITAL_BASED_OUTPATIENT_CLINIC_OR_DEPARTMENT_OTHER): Payer: Self-pay | Admitting: *Deleted

## 2011-09-15 NOTE — H&P (Signed)
Brittney [name] is an 55 y.o. female.   Chief Complaint: right facial cyst HPI: right facial cyst present for several years, without significant change in size.  Past Medical History  Diagnosis Date  . GERD (gastroesophageal reflux disease)   . Arthritis   . Depression   . Trigeminal neuralgia   . Headache     Past Surgical History  Procedure Date  . Septoplasty 2007  . Sinus exploration 2007  . Knee arthroscopy 1973    rt  . Tonsillectomy   . Ankle arthroplasty 2008    left  . Carpal tunnel release 2009    rt  . Pilonidal cyst / sinus excision 1977  . Joint replacement 2009    rt total knee  . Knee arthroplasty 2010    revision rt total knee  . Hand arthroplasty 1989    lt    History reviewed. No pertinent family history. Social History:  reports that she has never smoked. She does not have any smokeless tobacco history on file. She reports that she drinks alcohol. She reports that she does not use illicit drugs.  Allergies:  Allergies  Allergen Reactions  . Asa Arthritis Strength-Antacid (Aspirin Buffered)   . Carbamazepine   . Codeine   . Cymbalta (Duloxetine Hcl)   . Darvocet (Propoxyphene N-Acetaminophen)   . Elavil (Amitriptyline Hcl)   . Keflex   . Lamictal (Lamotrigine)   . Lyrica     Severe depression  . Neurontin (Gabapentin)   . Nsaids     Gi upset  . Nubain (Nalbuphine Hcl)     Spasms-itching  . Thorazine (Chlorpromazine Hcl)     No current facility-administered medications on file as of .   Medications Prior to Admission  Medication Sig Dispense Refill  . buPROPion (WELLBUTRIN SR) 200 MG 12 hr tablet Take 200 mg by mouth 2 (two) times daily.        Marland Kitchen escitalopram (LEXAPRO) 20 MG tablet Take 20 mg by mouth daily.        Marland Kitchen HYDROcodone-acetaminophen (LORTAB) 10-500 MG per tablet Take 1 tablet by mouth every 6 (six) hours as needed.        Marland Kitchen LORazepam (ATIVAN) 0.5 MG tablet Take 0.5 mg by mouth every 8 (eight) hours.        . magnesium 30  MG tablet Take 30 mg by mouth 2 (two) times daily.        Marland Kitchen zolpidem (AMBIEN) 10 MG tablet Take 10 mg by mouth at bedtime as needed.          No results found for this or any previous visit (from the past 48 hour(s)). No results found.  ROS: otherwise negative  Height 5\' 3"  (1.6 m), weight 72.576 kg (160 lb).  PHYSICAL EXAM: Overall appearance:  Healthy appearing, in no distress Head:  Normocephalic, atraumatic. Ears: External auditory canals are clear; tympanic membranes are intact and the middle ears are free of any effusion. Nose: External nose is healthy in appearance. Internal nasal exam free of any lesions or obstruction. Oral Cavity:  There are no mucosal lesions or masses identified. Oral Pharynx/Hypopharynx/Larynx: no signs of any mucosal lesions or masses identified. Vocal cords move normally. Neuro:  No identifiable neurologic deficits. Neck: No palpable neck masses. Face: One a half to 2 cm right facial cyst, superficial to the parotid gland.  Studies Reviewed: none    Assessment/Plan Recommend surgical excision of right facial cyst under local anesthesia with intravenous sedation and monitored anesthesia  care. We discussed the unlikely possibility of having to dissect out the facial nerve.  Kaymon Denomme H 09/15/2011, 4:17 PM

## 2011-09-15 NOTE — Progress Notes (Signed)
Pt has had multiple surgeries To bring allergy list to be sure we have all

## 2011-09-18 ENCOUNTER — Ambulatory Visit (HOSPITAL_BASED_OUTPATIENT_CLINIC_OR_DEPARTMENT_OTHER)
Admission: RE | Admit: 2011-09-18 | Discharge: 2011-09-18 | Disposition: A | Discharge status: Home / Self Care | Payer: BC Managed Care – PPO | Source: Ambulatory Visit | Attending: Otolaryngology | Admitting: Otolaryngology

## 2011-09-18 ENCOUNTER — Encounter (HOSPITAL_BASED_OUTPATIENT_CLINIC_OR_DEPARTMENT_OTHER): Payer: Self-pay | Admitting: Anesthesiology

## 2011-09-18 ENCOUNTER — Other Ambulatory Visit: Payer: Self-pay | Admitting: Otolaryngology

## 2011-09-18 ENCOUNTER — Encounter (HOSPITAL_BASED_OUTPATIENT_CLINIC_OR_DEPARTMENT_OTHER)
Admission: RE | Disposition: A | Discharge status: Home / Self Care | Payer: Self-pay | Source: Ambulatory Visit | Attending: Otolaryngology

## 2011-09-18 ENCOUNTER — Ambulatory Visit (HOSPITAL_BASED_OUTPATIENT_CLINIC_OR_DEPARTMENT_OTHER): Payer: BC Managed Care – PPO | Admitting: Anesthesiology

## 2011-09-18 DIAGNOSIS — D2330 Other benign neoplasm of skin of unspecified part of face: Secondary | ICD-10-CM | POA: Insufficient documentation

## 2011-09-18 DIAGNOSIS — R51 Headache: Secondary | ICD-10-CM | POA: Insufficient documentation

## 2011-09-18 DIAGNOSIS — K219 Gastro-esophageal reflux disease without esophagitis: Secondary | ICD-10-CM | POA: Insufficient documentation

## 2011-09-18 HISTORY — DX: Headache: R51

## 2011-09-18 HISTORY — DX: Unspecified osteoarthritis, unspecified site: M19.90

## 2011-09-18 HISTORY — DX: Major depressive disorder, single episode, unspecified: F32.9

## 2011-09-18 HISTORY — DX: Depression, unspecified: F32.A

## 2011-09-18 HISTORY — DX: Trigeminal neuralgia: G50.0

## 2011-09-18 HISTORY — DX: Gastro-esophageal reflux disease without esophagitis: K21.9

## 2011-09-18 HISTORY — PX: EAR CYST EXCISION: SHX22

## 2011-09-18 SURGERY — CYST REMOVAL
Anesthesia: Monitor Anesthesia Care | Site: Face | Laterality: Right | Wound class: Clean

## 2011-09-18 MED ORDER — PROMETHAZINE HCL 25 MG/ML IJ SOLN: 6.2500 mg | INTRAMUSCULAR | Status: DC | PRN

## 2011-09-18 MED ORDER — CLINDAMYCIN PHOSPHATE 600 MG/50ML IV SOLN
600.0000 mg | INTRAVENOUS | Status: AC
  Administered 2011-09-18: 600 mg via INTRAVENOUS

## 2011-09-18 MED ORDER — MIDAZOLAM HCL 5 MG/5ML IJ SOLN
INTRAMUSCULAR | Status: DC | PRN
  Administered 2011-09-18: 2 mg via INTRAVENOUS

## 2011-09-18 MED ORDER — DEXAMETHASONE SODIUM PHOSPHATE 10 MG/ML IJ SOLN
INTRAMUSCULAR | Status: DC | PRN
  Administered 2011-09-18: 10 mg via INTRAVENOUS

## 2011-09-18 MED ORDER — FENTANYL CITRATE 0.05 MG/ML IJ SOLN: 25.0000 ug | INTRAMUSCULAR | Status: DC | PRN

## 2011-09-18 MED ORDER — LIDOCAINE HCL (CARDIAC) 20 MG/ML IV SOLN
INTRAVENOUS | Status: DC | PRN
  Administered 2011-09-18: 60 mg via INTRAVENOUS

## 2011-09-18 MED ORDER — CLINDAMYCIN HCL 300 MG PO CAPS: 300.0000 mg | ORAL_CAPSULE | Freq: Three times a day (TID) | ORAL | Status: AC

## 2011-09-18 MED ORDER — FENTANYL CITRATE 0.05 MG/ML IJ SOLN
INTRAMUSCULAR | Status: DC | PRN
  Administered 2011-09-18: 25 ug via INTRAVENOUS

## 2011-09-18 MED ORDER — LIDOCAINE-EPINEPHRINE 1 %-1:100000 IJ SOLN
INTRAMUSCULAR | Status: DC | PRN
  Administered 2011-09-18: 2.5 mL

## 2011-09-18 MED ORDER — ONDANSETRON HCL 4 MG/2ML IJ SOLN
INTRAMUSCULAR | Status: DC | PRN
  Administered 2011-09-18: 4 mg via INTRAVENOUS

## 2011-09-18 MED ORDER — PROPOFOL 10 MG/ML IV EMUL
INTRAVENOUS | Status: DC | PRN
  Administered 2011-09-18: 75 ug/kg/min via INTRAVENOUS

## 2011-09-18 MED ORDER — LACTATED RINGERS IV SOLN
INTRAVENOUS | Status: DC
  Administered 2011-09-18: 07:00:00 via INTRAVENOUS

## 2011-09-18 SURGICAL SUPPLY — 57 items
ADH SKN CLS APL DERMABOND .7 (GAUZE/BANDAGES/DRESSINGS) ×1
APL SKNCLS STERI-STRIP NONHPOA (GAUZE/BANDAGES/DRESSINGS)
ATTRACTOMAT 16X20 MAGNETIC DRP (DRAPES) IMPLANT
BENZOIN TINCTURE PRP APPL 2/3 (GAUZE/BANDAGES/DRESSINGS) IMPLANT
BLADE SURG 15 STRL LF DISP TIS (BLADE) ×1 IMPLANT
BLADE SURG 15 STRL SS (BLADE) ×2
CANISTER SUCTION 1200CC (MISCELLANEOUS) ×1 IMPLANT
CLEANER CAUTERY TIP 5X5 PAD (MISCELLANEOUS) ×1 IMPLANT
CLIP TI MEDIUM 6 (CLIP) IMPLANT
CLIP TI WIDE RED SMALL 6 (CLIP) IMPLANT
CLOTH BEACON ORANGE TIMEOUT ST (SAFETY) ×2 IMPLANT
CORDS BIPOLAR (ELECTRODE) IMPLANT
COVER MAYO STAND STRL (DRAPES) ×2 IMPLANT
COVER TABLE BACK 60X90 (DRAPES) ×2 IMPLANT
DERMABOND ADVANCED (GAUZE/BANDAGES/DRESSINGS) ×1
DERMABOND ADVANCED .7 DNX12 (GAUZE/BANDAGES/DRESSINGS) IMPLANT
DRAIN JACKSON RD 7FR 3/32 (WOUND CARE) IMPLANT
DRAIN PENROSE 1/4X12 LTX STRL (WOUND CARE) IMPLANT
DRAPE U-SHAPE 76X120 STRL (DRAPES) ×2 IMPLANT
ELECT COATED BLADE 2.86 ST (ELECTRODE) ×2 IMPLANT
ELECT REM PT RETURN 9FT ADLT (ELECTROSURGICAL) ×2
ELECTRODE REM PT RTRN 9FT ADLT (ELECTROSURGICAL) ×1 IMPLANT
EVACUATOR SILICONE 100CC (DRAIN) IMPLANT
GAUZE SPONGE 4X4 12PLY STRL LF (GAUZE/BANDAGES/DRESSINGS) IMPLANT
GAUZE SPONGE 4X4 16PLY XRAY LF (GAUZE/BANDAGES/DRESSINGS) IMPLANT
GLOVE ECLIPSE 7.5 STRL STRAW (GLOVE) ×2 IMPLANT
GLOVE INDICATOR 7.0 STRL GRN (GLOVE) ×1 IMPLANT
GLOVE SKINSENSE NS SZ6.5 (GLOVE) ×1
GLOVE SKINSENSE STRL SZ6.5 (GLOVE) IMPLANT
GOWN PREVENTION PLUS XLARGE (GOWN DISPOSABLE) ×3 IMPLANT
NDL HYPO 25X1 1.5 SAFETY (NEEDLE) ×1 IMPLANT
NEEDLE 27GAX1X1/2 (NEEDLE) ×1 IMPLANT
NEEDLE HYPO 25X1 1.5 SAFETY (NEEDLE) ×2 IMPLANT
NS IRRIG 1000ML POUR BTL (IV SOLUTION) IMPLANT
PACK BASIN DAY SURGERY FS (CUSTOM PROCEDURE TRAY) ×2 IMPLANT
PAD CLEANER CAUTERY TIP 5X5 (MISCELLANEOUS) ×1
PENCIL FOOT CONTROL (ELECTRODE) ×2 IMPLANT
RUBBERBAND STERILE (MISCELLANEOUS) IMPLANT
SHEET MEDIUM DRAPE 40X70 STRL (DRAPES) IMPLANT
SPONGE GAUZE 2X2 8PLY STRL LF (GAUZE/BANDAGES/DRESSINGS) IMPLANT
STAPLER VISISTAT 35W (STAPLE) IMPLANT
STRIP CLOSURE SKIN 1/2X4 (GAUZE/BANDAGES/DRESSINGS) IMPLANT
SUCTION FRAZIER TIP 10 FR DISP (SUCTIONS) IMPLANT
SUT CHROMIC 3 0 PS 2 (SUTURE) IMPLANT
SUT CHROMIC 4 0 P 3 18 (SUTURE) ×1 IMPLANT
SUT ETHILON 4 0 PS 2 18 (SUTURE) IMPLANT
SUT ETHILON 5 0 P 3 18 (SUTURE)
SUT NYLON ETHILON 5-0 P-3 1X18 (SUTURE) IMPLANT
SUT PLAIN 5 0 P 3 18 (SUTURE) IMPLANT
SUT SILK 4 0 TIES 17X18 (SUTURE) IMPLANT
SUT SURG 6 0 PRE1/P 10 (SUTURE) IMPLANT
SUT VICRYL 4-0 PS2 18IN ABS (SUTURE) IMPLANT
SYR BULB 3OZ (MISCELLANEOUS) IMPLANT
SYR CONTROL 10ML LL (SYRINGE) ×2 IMPLANT
TRAY DSU PREP LF (CUSTOM PROCEDURE TRAY) ×2 IMPLANT
TUBE CONNECTING 20X1/4 (TUBING) ×1 IMPLANT
WATER STERILE IRR 1000ML POUR (IV SOLUTION) ×1 IMPLANT

## 2011-09-18 NOTE — Interval H&P Note (Signed)
History and Physical Interval Note:  09/18/2011 6:58 AM  Brittney [name]  has presented today for surgery, with the diagnosis of facial cyst  The various methods of treatment have been discussed with the patient and family. After consideration of risks, benefits and other options for treatment, the patient has consented to  Procedure(s): CYST REMOVAL as a surgical intervention .  The patients' history has been reviewed, patient examined, no change in status, stable for surgery.  I have reviewed the patients' chart and labs.  Questions were answered to the patient's satisfaction.     Shanan Fitzpatrick H  The patient has been re-examined.  The patient's history and physical has been reviewed and is unchanged.  There is no change in the plan of care.   Serena Colonel, MD

## 2011-09-18 NOTE — Anesthesia Procedure Notes (Signed)
Procedures

## 2011-09-18 NOTE — Anesthesia Preprocedure Evaluation (Addendum)
Anesthesia Evaluation  Patient identified by MRN, date of birth, ID band Patient awake    Reviewed: Allergy & Precautions, H&P , NPO status , Patient's Chart, lab work & pertinent test results  Airway Mallampati: II TM Distance: >3 FB Neck ROM: Full    Dental No notable dental hx. (+) Teeth Intact   Pulmonary neg pulmonary ROS,  clear to auscultation  Pulmonary exam normal       Cardiovascular neg cardio ROS Regular Normal    Neuro/Psych  Headaches (took hydrocodone this am),    GI/Hepatic negative GI ROS, GERD-  Medicated and Controlled,  Endo/Other  Negative Endocrine ROS  Renal/GU negative Renal ROS     Musculoskeletal   Abdominal Normal abdominal exam  (+)   Peds  Hematology negative hematology ROS (+)   Anesthesia Other Findings   Reproductive/Obstetrics negative OB ROS                          Anesthesia Physical Anesthesia Plan  ASA: II  Anesthesia Plan: MAC   Post-op Pain Management:    Induction:   Airway Management Planned:   Additional Equipment:   Intra-op Plan:   Post-operative Plan:   Informed Consent: I have reviewed the patients History and Physical, chart, labs and discussed the procedure including the risks, benefits and alternatives for the proposed anesthesia with the patient or authorized representative who has indicated his/her understanding and acceptance.   Dental advisory given  Plan Discussed with: CRNA and Surgeon  Anesthesia Plan Comments: (Plan routine monitors, MAC with local by surgeon)        Anesthesia Quick Evaluation

## 2011-09-18 NOTE — Op Note (Signed)
OPERATIVE REPORT  DATE OF SURGERY: 09/18/2011  PATIENT:  Brittney [name],  55 y.o. female  PRE-OPERATIVE DIAGNOSIS:  facial cyst  POST-OPERATIVE DIAGNOSIS:  Right Facial Cyst  PROCEDURE:  Procedure(s): CYST REMOVAL  SURGEON:  Susy Frizzle, MD  ASSISTANTS: none   ANESTHESIA:   IV sedation  EBL:  0 ml  DRAINS: none   LOCAL MEDICATIONS USED:  OTHER lidocaine with epinephrine, less than 2 cc  SPECIMEN:  Source of Specimen:  Right facial cyst  COUNTS:  YES  PROCEDURE DETAILS: Patient was taken to the operating room and placed on the operating table in the supine position. Following induction of intravenous sedation the right facial area was prepped with Betadine solution and sterile drapes. Xylocaine with epinephrine was infiltrated. An ellipse of skin was outlined with a marking pen in a vertical fashion parallel to the preauricular crease. A 15 scalpel was used to incise the skin and to sharply dissect around the entire lesion. Scissors were used also to divide the fascial tissue. The parotid fascia and facial nerve branches were not identified and were felt to be deep to the level of dissection. The lesion was sent for pathologic evaluation in its entirety. The wound was reapproximated using 4-0 chromic suture and Dermabond on the skin layer. She tolerated this well and was transferred to PACU in stable condition.Marland Kitchen   PATIENT DISPOSITION:  PACU - hemodynamically stable.

## 2011-09-18 NOTE — Transfer of Care (Signed)
Immediate Anesthesia Transfer of Care Note  Patient: Brittney [name]  Procedure(s) Performed:  CYST REMOVAL - right facial cyst  Patient Location: PACU  Anesthesia Type: MAC  Level of Consciousness: awake and alert   Airway & Oxygen Therapy: Patient Spontanous Breathing  Post-op Assessment: Report given to PACU RN and Post -op Vital signs reviewed and stable  Post vital signs: Reviewed and stable  Complications: No apparent anesthesia complications

## 2011-09-18 NOTE — Anesthesia Postprocedure Evaluation (Signed)
  Anesthesia Post-op Note  Patient: Brittney [name]  Procedure(s) Performed:  CYST REMOVAL - right facial cyst  Patient Location: PACU  Anesthesia Type: MAC  Level of Consciousness: awake, alert  and oriented  Airway and Oxygen Therapy: Patient Spontanous Breathing  Post-op Pain: none  Post-op Assessment: Post-op Vital signs reviewed, Patient's Cardiovascular Status Stable, Respiratory Function Stable, Patent Airway, No signs of Nausea or vomiting and Pain level controlled  Post-op Vital Signs: Reviewed and stable  Complications: No apparent anesthesia complications

## 2011-09-21 ENCOUNTER — Encounter (HOSPITAL_BASED_OUTPATIENT_CLINIC_OR_DEPARTMENT_OTHER): Payer: Self-pay | Admitting: Otolaryngology

## 2012-10-28 ENCOUNTER — Ambulatory Visit: Payer: BC Managed Care – PPO

## 2012-10-28 ENCOUNTER — Telehealth: Payer: Self-pay | Admitting: Family Medicine

## 2012-10-28 ENCOUNTER — Ambulatory Visit (INDEPENDENT_AMBULATORY_CARE_PROVIDER_SITE_OTHER): Payer: BC Managed Care – PPO | Admitting: Family Medicine

## 2012-10-28 VITALS — BP 115/64 | HR 63 | Temp 98.5°F | Resp 16 | Ht 63.0 in | Wt 169.4 lb

## 2012-10-28 DIAGNOSIS — T148XXA Other injury of unspecified body region, initial encounter: Secondary | ICD-10-CM

## 2012-10-28 DIAGNOSIS — R079 Chest pain, unspecified: Secondary | ICD-10-CM

## 2012-10-28 DIAGNOSIS — R0781 Pleurodynia: Secondary | ICD-10-CM

## 2012-10-28 MED ORDER — OXYCODONE-ACETAMINOPHEN 5-325 MG PO TABS: 1.0000 | ORAL_TABLET | Freq: Three times a day (TID) | ORAL | Status: DC | PRN

## 2012-10-28 NOTE — Progress Notes (Signed)
[facility]:  Office Visit  Chief Complaint:  Chief Complaint  Patient presents with  . Rib Injury    right side sternum- last thursday    HPI: Brittney [name] is a 57 y.o. female who complains of rigth sided rib pain after laying down to take pictures with rabbit, Petey.  Breathing and coughing worsens pain, laying down she has sharp 8/10 pain. Takes 15 minutes to turn over. Denies any osteoporosis or osteopenia. Has taken her pain meds she has for her Trigeminal neuralgia issues but does not touch it.  Heard her ribs crunch 3 times.   Past Medical History  Diagnosis Date  . GERD (gastroesophageal reflux disease)   . Depression   . Trigeminal neuralgia   . Headache   . Anxiety   . Arthritis     knees and hands   Past Surgical History  Procedure Date  . Septoplasty 2007  . Sinus exploration 2007  . Knee arthroscopy 1973    rt  . Tonsillectomy   . Ankle arthroplasty 2008    left  . Carpal tunnel release 2009    rt  . Pilonidal cyst / sinus excision 1977  . Joint replacement 2009    rt total knee  . Knee arthroplasty 2010    revision rt total knee  . Hand arthroplasty 1989    lt  . Ear cyst excision 09/18/2011    Procedure: CYST REMOVAL;  Surgeon: Susy Frizzle, MD;  Location: [facility];  Service: ENT;  Laterality: Right;  right facial cyst   History   Social History  . Marital Status: Single    Spouse Name: N/A    Number of Children: N/A  . Years of Education: N/A   Social History Main Topics  . Smoking status: Never Smoker   . Smokeless tobacco: None  . Alcohol Use: Yes     Comment: occasional  . Drug Use: No  . Sexually Active:    Other Topics Concern  . None   Social History Narrative  . None   Family History  Problem Relation Age of Onset  . COPD Mother   . Heart disease Father   . Diabetes Brother   . Stroke Brother    Allergies  Allergen Reactions  . Asa Arthritis Strength-Antacid (Aspirin Buffered)    . Carbamazepine   . Celebrex (Celecoxib) Other (See Comments)    Stomach pain  . Cephalexin   . Codeine   . Cymbalta (Duloxetine Hcl)   . Darvocet (Propoxyphene-Acetaminophen)   . Diazepam Other (See Comments)    migraines  . Dilantin (Phenytoin Sodium Extended) Other (See Comments)    Zombie like feeling  . Elavil (Amitriptyline Hcl)   . Klonopin (Clonazepam) Other (See Comments)    Makes her feel like a zombie  . Lamictal (Lamotrigine)   . Neurontin (Gabapentin)   . Nsaids     Gi upset  . Nubain (Nalbuphine Hcl)     Spasms-itching  . Nubain (Nalbuphine Hcl)     Muscle spasms   . Oxcarbazepine     Increase depression/severe constipation  . Prednisone Other (See Comments)    Headaches  . Pregabalin     Severe depression, severe edema  . Thorazine (Chlorpromazine Hcl)    Prior to Admission medications   Medication Sig Start Date End Date Taking? Authorizing Provider  buPROPion (WELLBUTRIN SR) 200 MG 12 hr tablet Take 200 mg by mouth 2 (two) times daily.  Yes Historical Provider, MD  escitalopram (LEXAPRO) 20 MG tablet Take 20 mg by mouth daily.     Yes Historical Provider, MD  HYDROcodone-acetaminophen (NORCO) 10-325 MG per tablet Take 1 tablet by mouth every 6 (six) hours as needed.   Yes Historical Provider, MD  HYDROcodone-acetaminophen (LORTAB) 10-500 MG per tablet Take 1 tablet by mouth every 6 (six) hours as needed.      Historical Provider, MD  LORazepam (ATIVAN) 0.5 MG tablet Take 0.5 mg by mouth every 8 (eight) hours.      Historical Provider, MD  magnesium 30 MG tablet Take 30 mg by mouth 2 (two) times daily.      Historical Provider, MD  zolpidem (AMBIEN) 10 MG tablet Take 10 mg by mouth at bedtime as needed.      Historical Provider, MD     ROS: The patient denies fevers, chills, night sweats, unintentional weight loss, chest pain, palpitations, wheezing, dyspnea on exertion, nausea, vomiting, abdominal pain, dysuria, hematuria, melena, numbness, weakness,  or tingling.   All other systems have been reviewed and were otherwise negative with the exception of those mentioned in the HPI and as above.    PHYSICAL EXAM: Filed Vitals:   10/28/12 1309  BP: 115/64  Pulse: 63  Temp: 98.5 F (36.9 C)  Resp: 16   Filed Vitals:   10/28/12 1309  Height: 5\' 3"  (1.6 m)  Weight: 169 lb 6.4 oz (76.839 kg)   Body mass index is 30.01 kg/(m^2).  General: Alert, no acute distress HEENT:  Normocephalic, atraumatic, oropharynx patent.  Cardiovascular:  Regular rate and rhythm, no rubs murmurs or gallops.  No Carotid bruits, radial pulse intact. No pedal edema.  Respiratory: Clear to auscultation bilaterally.  No wheezes, rales, or rhonchi.  No cyanosis, no use of accessory musculature GI: No organomegaly, abdomen is soft and non-tender, positive bowel sounds.  No masses. Skin: No rashes. Neurologic: Facial musculature symmetric. Psychiatric: Patient is appropriate throughout our interaction. Lymphatic: No cervical lymphadenopathy Musculoskeletal: Gait intact. + tneder along right side rib cage nearmid sternum and also underneath breast   LABS: Results for orders placed during the hospital encounter of 02/08/09  ABO/RH      Component Value Range   ABO/RH(D) O POS    TYPE AND SCREEN      Component Value Range   ABO/RH(D) O POS     Antibody Screen NEG     Sample Expiration 02/11/2009    BASIC METABOLIC PANEL      Component Value Range   Sodium 136  135 - 145 mEq/L   Potassium 4.4  3.5 - 5.1 mEq/L   Chloride 104  96 - 112 mEq/L   CO2 28  19 - 32 mEq/L   Glucose, Bld 151 (*) 70 - 99 mg/dL   BUN 14  6 - 23 mg/dL   Creatinine, Ser 1.61  0.4 - 1.2 mg/dL   Calcium 8.0 (*) 8.4 - 10.5 mg/dL   GFR calc non Af Amer >60  >60 mL/min   GFR calc Af Amer    >60 mL/min   Value: >60            The eGFR has been calculated     using the MDRD equation.     This calculation has not been     validated in all clinical     situations.     eGFR's persistently      <60 mL/min signify     possible Chronic Kidney  Disease.  CBC      Component Value Range   WBC 6.4  4.0 - 10.5 K/uL   RBC 3.27 (*) 3.87 - 5.11 MIL/uL   Hemoglobin 10.5 (*) 12.0 - 15.0 g/dL   HCT 52.8 (*) 41.3 - 24.4 %   MCV 93.3  78.0 - 100.0 fL   MCHC 34.5  30.0 - 36.0 g/dL   RDW 01.0  27.2 - 53.6 %   Platelets 149 (*) 150 - 400 K/uL  PROTIME-INR      Component Value Range   Prothrombin Time 14.9  11.6 - 15.2 seconds   INR 1.1  0.00 - 1.49  BASIC METABOLIC PANEL      Component Value Range   Sodium 134 (*) 135 - 145 mEq/L   Potassium 3.7  3.5 - 5.1 mEq/L   Chloride 104  96 - 112 mEq/L   CO2 26  19 - 32 mEq/L   Glucose, Bld 117 (*) 70 - 99 mg/dL   BUN 6  6 - 23 mg/dL   Creatinine, Ser 6.44  0.4 - 1.2 mg/dL   Calcium 8.0 (*) 8.4 - 10.5 mg/dL   GFR calc non Af Amer >60  >60 mL/min   GFR calc Af Amer    >60 mL/min   Value: >60            The eGFR has been calculated     using the MDRD equation.     This calculation has not been     validated in all clinical     situations.     eGFR's persistently     <60 mL/min signify     possible Chronic Kidney Disease.  CBC      Component Value Range   WBC 5.5  4.0 - 10.5 K/uL   RBC 2.90 (*) 3.87 - 5.11 MIL/uL   Hemoglobin 9.4 (*) 12.0 - 15.0 g/dL   HCT 03.4 (*) 74.2 - 59.5 %   MCV 93.1  78.0 - 100.0 fL   MCHC 34.8  30.0 - 36.0 g/dL   RDW 63.8  75.6 - 43.3 %   Platelets 121 (*) 150 - 400 K/uL  PROTIME-INR      Component Value Range   Prothrombin Time 30.0 COUMADIN THERAPY (*) 11.6 - 15.2 seconds   INR 2.6 (*) 0.00 - 1.49  BASIC METABOLIC PANEL      Component Value Range   Sodium 139  135 - 145 mEq/L   Potassium 4.3  3.5 - 5.1 mEq/L   Chloride 105  96 - 112 mEq/L   CO2 30  19 - 32 mEq/L   Glucose, Bld 115 (*) 70 - 99 mg/dL   BUN 5 (*) 6 - 23 mg/dL   Creatinine, Ser 2.95  0.4 - 1.2 mg/dL   Calcium 8.2 (*) 8.4 - 10.5 mg/dL   GFR calc non Af Amer >60  >60 mL/min   GFR calc Af Amer    >60 mL/min   Value: >60             The eGFR has been calculated     using the MDRD equation.     This calculation has not been     validated in all clinical     situations.     eGFR's persistently     <60 mL/min signify     possible Chronic Kidney Disease.  CBC      Component Value Range   WBC 4.4  4.0 - 10.5  K/uL   RBC 2.70 (*) 3.87 - 5.11 MIL/uL   Hemoglobin 8.9 (*) 12.0 - 15.0 g/dL   HCT 14.7 (*) 82.9 - 56.2 %   MCV 92.4  78.0 - 100.0 fL   MCHC 35.4  30.0 - 36.0 g/dL   RDW 13.0  86.5 - 78.4 %   Platelets 137 (*) 150 - 400 K/uL  PROTIME-INR      Component Value Range   Prothrombin Time 26.9 COUMADIN THERAPY (*) 11.6 - 15.2 seconds   INR 2.3 (*) 0.00 - 1.49  PREGNANCY, URINE      Component Value Range   Preg Test, Ur       Value: NEGATIVE            THE SENSITIVITY OF THIS     METHODOLOGY IS >24 mIU/mL  URINALYSIS, ROUTINE W REFLEX MICROSCOPIC      Component Value Range   Color, Urine YELLOW  YELLOW   APPearance CLEAR  CLEAR   Specific Gravity, Urine 1.022  1.005 - 1.030   pH 6.0  5.0 - 8.0   Glucose, UA NEGATIVE  NEGATIVE mg/dL   Hgb urine dipstick NEGATIVE  NEGATIVE   Bilirubin Urine NEGATIVE  NEGATIVE   Ketones, ur NEGATIVE  NEGATIVE mg/dL   Protein, ur NEGATIVE  NEGATIVE mg/dL   Urobilinogen, UA 1.0  0.0 - 1.0 mg/dL   Nitrite NEGATIVE  NEGATIVE   Leukocytes, UA    NEGATIVE   Value: NEGATIVE MICROSCOPIC NOT DONE ON URINES WITH NEGATIVE PROTEIN, BLOOD, LEUKOCYTES, NITRITE, OR GLUCOSE <1000 mg/dL.  CBC      Component Value Range   WBC 4.7  4.0 - 10.5 K/uL   RBC 4.23  3.87 - 5.11 MIL/uL   Hemoglobin 13.4  12.0 - 15.0 g/dL   HCT 69.6  29.5 - 28.4 %   MCV 93.8  78.0 - 100.0 fL   MCHC 33.9  30.0 - 36.0 g/dL   RDW 13.2  44.0 - 10.2 %   Platelets 201  150 - 400 K/uL  COMPREHENSIVE METABOLIC PANEL      Component Value Range   Sodium 139  135 - 145 mEq/L   Potassium 4.2  3.5 - 5.1 mEq/L   Chloride 105  96 - 112 mEq/L   CO2 26  19 - 32 mEq/L   Glucose, Bld 97  70 - 99 mg/dL   BUN 12  6 - 23 mg/dL     Creatinine, Ser 7.25  0.4 - 1.2 mg/dL   Calcium 9.1  8.4 - 36.6 mg/dL   Total Protein 6.6  6.0 - 8.3 g/dL   Albumin 4.0  3.5 - 5.2 g/dL   AST 20  0 - 37 U/L   ALT 18  0 - 35 U/L   Alkaline Phosphatase 72  39 - 117 U/L   Total Bilirubin 0.6  0.3 - 1.2 mg/dL   GFR calc non Af Amer >60  >60 mL/min   GFR calc Af Amer    >60 mL/min   Value: >60            The eGFR has been calculated     using the MDRD equation.     This calculation has not been     validated in all clinical     situations.     eGFR's persistently     <60 mL/min signify     possible Chronic Kidney Disease.  PROTIME-INR      Component Value Range  Prothrombin Time 13.7  11.6 - 15.2 seconds   INR 1.0  0.00 - 1.49  APTT      Component Value Range   aPTT 35  24 - 37 seconds     EKG/XRAY:   Primary read interpreted by Dr. Conley Rolls at [facility]. No pneumothorax No obvious rib fractures   ASSESSMENT/PLAN: Encounter Diagnoses  Name Primary?  . Rib pain on right side Yes  . Chest pain   . Contusion    Will await for xray results Rx Percocet Hold Lortab if taking  Percocet Take Robaxen prn ( she laready has this) F/u prn    Brookley Spitler PHUONG, DO 10/28/2012 1:47 PM

## 2012-10-28 NOTE — Telephone Encounter (Signed)
LM that there are no fractures

## 2013-02-04 ENCOUNTER — Encounter: Payer: Self-pay | Admitting: Obstetrics & Gynecology

## 2013-02-05 ENCOUNTER — Encounter: Payer: Self-pay | Admitting: Obstetrics & Gynecology

## 2013-02-05 ENCOUNTER — Other Ambulatory Visit: Payer: Self-pay | Admitting: Anesthesiology

## 2013-02-05 DIAGNOSIS — M542 Cervicalgia: Secondary | ICD-10-CM

## 2013-02-06 ENCOUNTER — Ambulatory Visit (INDEPENDENT_AMBULATORY_CARE_PROVIDER_SITE_OTHER): Payer: BC Managed Care – PPO | Admitting: Obstetrics & Gynecology

## 2013-02-06 ENCOUNTER — Encounter: Payer: Self-pay | Admitting: Obstetrics & Gynecology

## 2013-02-06 VITALS — BP 114/68 | HR 60 | Resp 16

## 2013-02-06 DIAGNOSIS — R6889 Other general symptoms and signs: Secondary | ICD-10-CM

## 2013-02-06 NOTE — Patient Instructions (Signed)
We will call you with your Pap results.

## 2013-02-06 NOTE — Progress Notes (Signed)
56 yrs Single Caucasian female G0P0 here for repeat pap smear due to long h/o abnormal pap smears.   Denies problems or vaginal symptoms or STD concerns.  Having new neck and face pain issues.  Has MRI scheduled.    LMP:  2009 Contraception: PMP  General appearance: Healthy female    Exam:Pelvic exam: normal external genitalia, vulva, vagina, cervix, uterus and adnexa, VULVA: normal appearing vulva with no masses, tenderness or lesions, VAGINA: normal appearing vagina with normal color and discharge, no lesions, atrophic, CERVIX: normal appearing cervix without discharge or lesions.Pap smear obtained.  Assessment: ASCUS Pap, long h/o abnormals  Plan:  Pap with HR HPV testing today.  Will call with results and plan f/u. Marland Kitchen

## 2013-02-12 ENCOUNTER — Ambulatory Visit
Admission: RE | Admit: 2013-02-12 | Discharge: 2013-02-12 | Disposition: A | Discharge status: Home / Self Care | Payer: BC Managed Care – PPO | Source: Ambulatory Visit | Attending: Anesthesiology | Admitting: Anesthesiology

## 2013-02-12 DIAGNOSIS — M542 Cervicalgia: Secondary | ICD-10-CM

## 2013-02-24 NOTE — Progress Notes (Signed)
We can discuss at [facility] and I can order it then if we decide to proceed.

## 2013-02-26 ENCOUNTER — Encounter: Payer: Self-pay | Admitting: Physical Medicine & Rehabilitation

## 2013-03-18 ENCOUNTER — Ambulatory Visit: Payer: BC Managed Care – PPO | Admitting: Physical Medicine & Rehabilitation

## 2013-03-24 ENCOUNTER — Encounter: Payer: Self-pay | Admitting: Physical Medicine & Rehabilitation

## 2013-03-24 ENCOUNTER — Encounter: Payer: BC Managed Care – PPO | Attending: Physical Medicine & Rehabilitation

## 2013-03-24 ENCOUNTER — Ambulatory Visit (HOSPITAL_BASED_OUTPATIENT_CLINIC_OR_DEPARTMENT_OTHER): Payer: BC Managed Care – PPO | Admitting: Physical Medicine & Rehabilitation

## 2013-03-24 VITALS — BP 105/63 | HR 65 | Resp 14 | Ht 63.0 in | Wt 153.0 lb

## 2013-03-24 DIAGNOSIS — M25519 Pain in unspecified shoulder: Secondary | ICD-10-CM | POA: Insufficient documentation

## 2013-03-24 DIAGNOSIS — M25529 Pain in unspecified elbow: Secondary | ICD-10-CM | POA: Insufficient documentation

## 2013-03-24 DIAGNOSIS — M79609 Pain in unspecified limb: Secondary | ICD-10-CM | POA: Insufficient documentation

## 2013-03-24 DIAGNOSIS — G5622 Lesion of ulnar nerve, left upper limb: Secondary | ICD-10-CM

## 2013-03-24 DIAGNOSIS — M549 Dorsalgia, unspecified: Secondary | ICD-10-CM | POA: Insufficient documentation

## 2013-03-24 DIAGNOSIS — G5620 Lesion of ulnar nerve, unspecified upper limb: Secondary | ICD-10-CM

## 2013-03-24 DIAGNOSIS — M542 Cervicalgia: Secondary | ICD-10-CM | POA: Insufficient documentation

## 2013-03-25 NOTE — Patient Instructions (Signed)
Results to referring MD within 48 hours

## 2013-03-25 NOTE — Progress Notes (Signed)
EMG performed 03/25/2013.  See EMG report under media tab.

## 2013-06-04 ENCOUNTER — Other Ambulatory Visit: Payer: Self-pay | Admitting: Gastroenterology

## 2013-06-04 DIAGNOSIS — R1310 Dysphagia, unspecified: Secondary | ICD-10-CM

## 2013-06-12 ENCOUNTER — Ambulatory Visit
Admission: RE | Admit: 2013-06-12 | Discharge: 2013-06-12 | Disposition: A | Discharge status: Home / Self Care | Payer: BC Managed Care – PPO | Source: Ambulatory Visit | Attending: Gastroenterology | Admitting: Gastroenterology

## 2013-06-12 DIAGNOSIS — R1310 Dysphagia, unspecified: Secondary | ICD-10-CM

## 2013-07-02 ENCOUNTER — Other Ambulatory Visit: Payer: Self-pay | Admitting: Gastroenterology

## 2013-10-23 ENCOUNTER — Ambulatory Visit (INDEPENDENT_AMBULATORY_CARE_PROVIDER_SITE_OTHER): Payer: BC Managed Care – PPO | Admitting: Obstetrics & Gynecology

## 2013-10-23 ENCOUNTER — Encounter: Payer: Self-pay | Admitting: Obstetrics & Gynecology

## 2013-10-23 VITALS — BP 96/58 | HR 64 | Resp 16 | Ht 63.0 in | Wt 146.0 lb

## 2013-10-23 DIAGNOSIS — Z124 Encounter for screening for malignant neoplasm of cervix: Secondary | ICD-10-CM

## 2013-10-23 DIAGNOSIS — Z0000 Encounter for general adult medical examination without abnormal findings: Secondary | ICD-10-CM

## 2013-10-23 DIAGNOSIS — L659 Nonscarring hair loss, unspecified: Secondary | ICD-10-CM

## 2013-10-23 DIAGNOSIS — Z01419 Encounter for gynecological examination (general) (routine) without abnormal findings: Secondary | ICD-10-CM

## 2013-10-23 LAB — POCT URINALYSIS DIPSTICK
BILIRUBIN UA: NEGATIVE
GLUCOSE UA: NEGATIVE
KETONES UA: NEGATIVE
Nitrite, UA: NEGATIVE
PH UA: 5
Protein, UA: NEGATIVE
RBC UA: NEGATIVE
Urobilinogen, UA: NEGATIVE

## 2013-10-23 LAB — TSH: TSH: 2.958 u[IU]/mL (ref 0.350–4.500)

## 2013-10-23 LAB — TESTOSTERONE: TESTOSTERONE: 20 ng/dL (ref 10–70)

## 2013-10-23 LAB — HEMOGLOBIN, FINGERSTICK: Hemoglobin, fingerstick: 12.6 g/dL (ref 12.0–16.0)

## 2013-10-23 NOTE — Patient Instructions (Signed)

## 2013-10-23 NOTE — Progress Notes (Signed)
58 y.o. G0P0 SingleCaucasianF here for annual exam.  Has been doing some physical therapy on her neck, back, shoulder.  Didn't really help her due to the pain associated with it.  Trigger point injections were done.  INjections did help some but this wore off after, unfortunately, too short of a period of time.  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  Had epidural injection that really helped but lasted only 2 weeks.  Should have lasted much longer.  Not interested in any surgery at this time.      Seeing now Maurice Small, Dr. Paulino Rily replacement.  Had a reaction to the steroid injections that caused palpitations and chest pain.  Was evaluated and EKG was negative. Symptoms resolved as got further and further away from the injections.    Has lost weight this year due to depression and decreased appetite.  She reports weight is up now about 5 pounds.  Also, noticed more hair thinning.  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  Discussed supplements.  I have no recent blood work on pt so will check TSH and testosterone today.  Patient's last menstrual period was 12/08/2011.          Sexually active: no  The current method of family planning is none.    Exercising: yes  walking Smoker:  no  Health Maintenance: Pap:  02/06/13 WNL/neagtive HR HPV History of abnormal Pap:  yes MMG:  06/26/13 normal Colonoscopy:  2009 repeat in 10 years BMD:   2010 normal, -0.3/-0.6 TDaP:  2012 Screening Labs: Dr. Justin Mend, Hb today: 12.6, Urine today: WBC-1+   reports that she has never smoked. She has never used smokeless tobacco. She reports that she does not drink alcohol or use illicit drugs.  Past Medical History  Diagnosis Date  . GERD (gastroesophageal reflux disease)   . Depression   . Trigeminal neuralgia   . Headache(784.0)   . Anxiety   . Arthritis     knees and hands    Past Surgical History  Procedure Laterality Date  . Septoplasty  2007  . Sinus exploration  2007  . Knee arthroscopy  1973    rt  .  Tonsillectomy    . Ankle arthroplasty  2008    left  . Carpal tunnel release  2009    rt  . Pilonidal cyst / sinus excision  1977  . Joint replacement  2009    rt total knee  . Knee arthroplasty  2010    revision rt total knee  . Hand arthroplasty  1989    lt  . Ear cyst excision  09/18/2011    Procedure: CYST REMOVAL;  Surgeon: Beckie Salts, MD;  Location: [facility];  Service: ENT;  Laterality: Right;  right facial cyst  . Cortisone injection      total of 12 injections in muscle around neck, right shoulder, and spine  . Epidural block injection      in back and knee    Current Outpatient Prescriptions  Medication Sig Dispense Refill  . buPROPion (WELLBUTRIN XL) 300 MG 24 hr tablet Take 300 mg by mouth daily.      Marland Kitchen CALCIUM PO Take by mouth. Occasionally calcium and magnesium      . cyclobenzaprine (FLEXERIL) 10 MG tablet 10 mg.      . escitalopram (LEXAPRO) 20 MG tablet Take 20 mg by mouth daily.        Marland Kitchen HYDROcodone-acetaminophen (NORCO) 10-325 MG per tablet Take 1 tablet  by mouth every 6 (six) hours as needed.      Marland Kitchen LORazepam (ATIVAN) 0.5 MG tablet Take 0.5 mg by mouth every 8 (eight) hours.        . Multiple Vitamins-Minerals (MULTIVITAMIN PO) Take by mouth. Occasionally      . Omeprazole-Sodium Bicarbonate (ZEGERID) 20-1100 MG CAPS capsule       . promethazine (PHENERGAN) 25 MG tablet       . SUMAtriptan (IMITREX) 25 MG tablet Take 25 mg by mouth every 2 (two) hours as needed for migraine.      Marland Kitchen ZOHYDRO ER 30 MG CP12       . zolpidem (AMBIEN) 10 MG tablet Take 10 mg by mouth at bedtime as needed.        . celecoxib (CELEBREX) 100 MG capsule Take 100 mg by mouth. As needed      . methocarbamol (ROBAXIN) 500 MG tablet Take 500 mg by mouth 4 (four) times daily as needed.       No current facility-administered medications for this visit.    Family History  Problem Relation Age of Onset  . COPD Mother   . Heart disease Father   . Diabetes Brother   .  Stroke Brother     ROS:  Pertinent items are noted in HPI.  Otherwise, a comprehensive ROS was negative.  Exam:   BP 96/58  Pulse 64  Resp 16  Ht 5\' 3"  (1.6 m)  Wt 146 lb (66.225 kg)  BMI 25.87 kg/m2  LMP 12/08/2011  Weight change: -18#  Height: 5\' 3"  (160 cm)  Ht Readings from Last 3 Encounters:  10/23/13 5\' 3"  (1.6 m)  03/24/13 5\' 3"  (1.6 m)  10/28/12 5\' 3"  (1.6 m)    General appearance: alert, cooperative and appears stated age Head: Normocephalic, without obvious abnormality, atraumatic Neck: no adenopathy, supple, symmetrical, trachea midline and thyroid normal to inspection and palpation Lungs: clear to auscultation bilaterally Breasts: normal appearance, no masses or tenderness Heart: regular rate and rhythm Abdomen: soft, non-tender; bowel sounds normal; no masses,  no organomegaly Extremities: extremities normal, atraumatic, no cyanosis or edema Skin: Skin color, texture, turgor normal. No rashes or lesions Lymph nodes: Cervical, supraclavicular, and axillary nodes normal. No abnormal inguinal nodes palpated Neurologic: Grossly normal   Pelvic: External genitalia:  no lesions              Urethra:  normal appearing urethra with no masses, tenderness or lesions              Bartholins and Skenes: normal                 Vagina: normal appearing vagina with normal color and discharge, no lesions              Cervix: no lesions              Pap taken: yes Bimanual Exam:  Uterus:  normal size, contour, position, consistency, mobility, non-tender              Adnexa: normal adnexa and no mass, fullness, tenderness               Rectovaginal: Confirms               Anus:  normal sphincter tone, no lesions  A:  Well Woman with normal exam PMP, no HRT H/O abnormal pap smears with neg pap and NL HR HPV 5/14 Hair thinning Long hx of depression due to  pain issues.  P:   Mammogram yearly pap smear only today Testosterone and TSH return annually or prn  An After  Visit Summary was printed and given to the patient.

## 2013-10-24 LAB — IPS PAP SMEAR ONLY

## 2013-10-30 ENCOUNTER — Telehealth: Payer: Self-pay

## 2013-10-30 NOTE — Telephone Encounter (Signed)
[email or web address]//kn 

## 2013-10-30 NOTE — Telephone Encounter (Signed)
Message copied by Robley Fries on Thu Oct 30, 2013 10:22 AM ------      Message from: Megan Salon      Created: Sat Oct 25, 2013  2:03 AM       Inform TSH and testosterone normal.  She is worried about hair loss.  Will need to follow up with dermatology at this point.   Pap ASCUS but was neg with neg HR HPV 6 months ago.  Needs repeat Pap and HR HPV 1 yr.  We discussed this.  08 recall. ------

## 2013-11-07 NOTE — Telephone Encounter (Signed)
Patient notified of results. See result note.  

## 2014-03-25 ENCOUNTER — Encounter: Payer: Self-pay | Admitting: Neurology

## 2014-03-25 ENCOUNTER — Ambulatory Visit (INDEPENDENT_AMBULATORY_CARE_PROVIDER_SITE_OTHER): Payer: BC Managed Care – PPO | Admitting: Neurology

## 2014-03-25 VITALS — BP 100/60 | HR 66 | Ht 62.99 in | Wt 142.0 lb

## 2014-03-25 DIAGNOSIS — R209 Unspecified disturbances of skin sensation: Secondary | ICD-10-CM

## 2014-03-25 DIAGNOSIS — M779 Enthesopathy, unspecified: Secondary | ICD-10-CM

## 2014-03-25 DIAGNOSIS — G5620 Lesion of ulnar nerve, unspecified upper limb: Secondary | ICD-10-CM

## 2014-03-25 NOTE — Progress Notes (Signed)
[facility] Clinic Note - Initial Visit   Date: 03/25/2014  Brittney [name] MRN: [id number] DOB: May 29, 1956   Dear Dr Daylene Katayama:  Thank you for your kind referral of Brittney [name] for consultation of bilateral arm pain. Although her history is well known to you, please allow [place] to reiterate it for the purpose of our medical record. The patient was accompanied to the clinic by self   History of Present Illness: Brittney [name] is a 58 y.o. right-handed Caucasian female with history of anxiety/depression, GERD, right trigeminal neuralgia s/p RFA (followed by Dr. Vertell Limber), right CTS s/p release, and migraines presenting for evaluation of bilateral (right > left) arm pain and numbness.  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  Following surgery, her left hand numbness/tingling resolved.  She stopped playing golf in 2006 and switched to photography.    Starting around ~2013, she developed electrical tingling/numbness involving the medial forearm and last two digits affecting both arms.  Symptoms are worse when she rested her arms on surfaces (sofa, table, etc) or flexing her elbows such as when using her camera.  Extending her arms improves her paresthesias within a few minutes.  She has also tried icing her arms which can help. Numbness and tingling is not constant, but she has a great deal of joint pain (neck, shoulders, elbow, hands, low back, R knee).  She complains of a lot of morning joint stiffness and pain.  She has benefit with NSAIDs, cannot take them due to GI upset.  She has been seeing Dr. Daylene Katayama for possible ulnar neuropathy after electrodiagnostic testing performed June 2014 showed very mild slowing across the left elbow.  Due to persistent and ongoing discomfort of the lateral aspect of the arms, she has tried a number of pain medications and occupational therapy  without any improvement. Vitamin B12, folate, ESR, and CRP was checked and within normal limits. She is referred for a second opinion regarding her arm dysesthesias.  Of note, she also has history of right trigeminal neuralgia affecting very localized region of the right supraorbital ridge and has has radiofrequency ablation for the pain without success.  She has previously tried neurontin, TCAs, Lyrica, cymbalta, venlafaxine, carbamazepine, oxcarbamazepine, lamictal, and dilantin which were stopped due to side effects/intolerance.  Currently, she sees Dr. Maryjean Ka, pain management, who has performed a number of nerve blocks for trigeminal neuralgia and back pain.     Out-side paper records, electronic medical record, and images have been reviewed where available and summarized as:  Labs 10/25/2013:  TSH 2.958  MRI cervical spine 02/12/2013:  Minimal to mild degenerative changes most prominent C4-5 level as detailed above.   MRI brain wwo contrast 08/15/2011: 1. Negative trigeminal nerve imaging.  2. Stable, normal MRI appearance of the brain.  3. Incidental sebaceous cyst type lesion superficial to the right parotid gland.    Past Medical History  Diagnosis Date  . GERD (gastroesophageal reflux disease)   . Depression   . Trigeminal neuralgia   . Headache(784.0)   . Anxiety   . Arthritis     knees and hands    Past Surgical History  Procedure Laterality Date  . Septoplasty  2007  . Sinus exploration  2007  . Knee arthroscopy  1973    rt  . Tonsillectomy    . Ankle arthroplasty  2008    left  . Carpal  tunnel release  2009    rt  . Pilonidal cyst / sinus excision  1977  . Joint replacement  2009    rt total knee  . Knee arthroplasty  2010    revision rt total knee  . Hand arthroplasty  1989    lt  . Ear cyst excision  09/18/2011    Procedure: CYST REMOVAL;  Surgeon: Beckie Salts, MD;  Location: [facility];  Service: ENT;  Laterality: Right;  right facial cyst    . Cortisone injection      total of 12 injections in muscle around neck, right shoulder, and spine  . Epidural block injection      in back and knee     Medications:  Current Outpatient Prescriptions on File Prior to Visit  Medication Sig Dispense Refill  . buPROPion (WELLBUTRIN XL) 300 MG 24 hr tablet Take 300 mg by mouth daily.      Marland Kitchen CALCIUM PO Take by mouth. Occasionally calcium and magnesium      . celecoxib (CELEBREX) 100 MG capsule Take 100 mg by mouth. As needed      . cyclobenzaprine (FLEXERIL) 10 MG tablet 10 mg.      . escitalopram (LEXAPRO) 20 MG tablet Take 20 mg by mouth daily.        Marland Kitchen HYDROcodone-acetaminophen (NORCO) 10-325 MG per tablet Take 1 tablet by mouth every 6 (six) hours as needed.      Marland Kitchen LORazepam (ATIVAN) 0.5 MG tablet Take 0.5 mg by mouth every 8 (eight) hours.        . methocarbamol (ROBAXIN) 500 MG tablet Take 500 mg by mouth 4 (four) times daily as needed.      . Multiple Vitamins-Minerals (MULTIVITAMIN PO) Take by mouth. Occasionally      . Omeprazole-Sodium Bicarbonate (ZEGERID) 20-1100 MG CAPS capsule       . promethazine (PHENERGAN) 25 MG tablet       . SUMAtriptan (IMITREX) 25 MG tablet Take 25 mg by mouth every 2 (two) hours as needed for migraine.      Marland Kitchen ZOHYDRO ER 30 MG CP12       . zolpidem (AMBIEN) 10 MG tablet Take 10 mg by mouth at bedtime as needed.         No current facility-administered medications on file prior to visit.    Allergies:  Allergies  Allergen Reactions  . Asa Arthritis Strength-Antacid [Aspirin Buffered]   . Carbamazepine   . Celebrex [Celecoxib] Other (See Comments)    Stomach pain  . Cephalexin   . Codeine   . Cymbalta [Duloxetine Hcl]   . Darvocet [Propoxyphene N-Acetaminophen]   . Dexilant [Dexlansoprazole]   . Diazepam Other (See Comments)    migraines  . Dilantin [Phenytoin Sodium Extended] Other (See Comments)    Zombie like feeling  . Klonopin [Clonazepam] Other (See Comments)    Makes her feel like a  zombie  . Lamictal [Lamotrigine]   . Lyrica [Pregabalin]   . Neurontin [Gabapentin]   . Nsaids     Gi upset  . Nubain [Nalbuphine Hcl]     Spasms-itching  . Nubain [Nalbuphine Hcl]     Muscle spasms   . Oxcarbazepine     Increase depression/severe constipation  . Prednisone Other (See Comments)    Headaches  . Pregabalin     Severe depression, severe edema  . Thorazine [Chlorpromazine Hcl]     Family History: Family History  Problem Relation Age of Onset  .  COPD Mother     Deceased, 65  . Heart disease Father     Deceased, 39  . Diabetes Brother   . Stroke Brother     Social History: History   Social History  . Marital Status: Single    Spouse Name: N/A    Number of Children: N/A  . Years of Education: N/A   Occupational History  . Not on file.   Social History Main Topics  . Smoking status: Never Smoker   . Smokeless tobacco: Never Used  . Alcohol Use: No  . Drug Use: No  . Sexual Activity: No   Other Topics Concern  . 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   She is in the process of applying for disability.   She is lives with partner, soon to be wife.   Highest level of education:  B.A.    Review of Systems:  CONSTITUTIONAL: No fevers, chills, night sweats, or weight loss.   EYES: No visual changes or eye pain ENT: No hearing changes.  No history of nose bleeds.   RESPIRATORY: No cough, wheezing and shortness of breath.   CARDIOVASCULAR: Negative for chest pain, and palpitations.   GI: Negative for abdominal discomfort, blood in stools or black stools.  No recent change in bowel habits.   GU:  No history of incontinence.   MUSCLOSKELETAL: +history of joint pain or swelling.  +myalgias.   SKIN: Negative for lesions, rash, and itching.   HEMATOLOGY/ONCOLOGY: Negative for prolonged bleeding, bruising easily, and swollen nodes.  No history of cancer.   ENDOCRINE: Negative for  cold or heat intolerance, polydipsia or goiter.   PSYCH:  +depression or anxiety symptoms.   NEURO: As Above.   Vital Signs:  BP 100/60  Pulse 66  Ht 5' 2.99" (1.6 m)  Wt 142 lb (64.411 kg)  BMI 25.16 kg/m2  SpO2 98%  LMP 12/08/2011   General Medical Exam:   General:  Well appearing, comfortable.   Eyes/ENT: see cranial nerve examination.   Neck: No masses appreciated.  Full range of motion without tenderness.  No carotid bruits. Respiratory:  Clear to auscultation, good air entry bilaterally.   Cardiac:  Regular rate and rhythm, no murmur.   Extremities:  No deformities, edema, or skin discoloration. Good capillary refill.   Skin:  Skin color, texture, turgor normal. No rashes or lesions.  Neurological Exam: MENTAL STATUS including orientation to time, place, person, recent and remote memory, attention span and concentration, language, and fund of knowledge is normal.  Speech is not dysarthric.  CRANIAL NERVES: II:  No visual field defects.  Unremarkable fundi.   III-IV-VI: Pupils equal round and reactive to light.  Normal conjugate, extra-ocular eye movements in all directions of gaze.  No nystagmus.  No ptosis.   V:  Normal facial sensation.     VII:  Normal facial symmetry and movements.  No pathologic facial reflexes.  VIII:  Normal hearing and vestibular function.   IX-X:  Normal palatal movement.   XI:  Normal shoulder shrug and head rotation.   XII:  Normal tongue strength and range of motion, no deviation or fasciculation.  MOTOR:  No atrophy, fasciculations or abnormal movements.  No pronator drift.  Tone is normal.  No tenderness to muscle palpation.  Right Upper Extremity:    Left Upper Extremity:    Deltoid  5/5   Deltoid  5/5  Biceps  5/5   Biceps  5/5   Triceps  5/5   Triceps  5/5   Wrist extensors  5/5   Wrist extensors  5/5   Wrist flexors  5/5   Wrist flexors  5/5   Finger extensors  5/5   Finger extensors  5/5   Finger flexors  5/5   Finger flexors   5/5   Dorsal interossei  5/5   Dorsal interossei  5/5   Abductor pollicis  5/5   Abductor pollicis  5/5   Tone (Ashworth scale)  0  Tone (Ashworth scale)  0   Right Lower Extremity:    Left Lower Extremity:    Hip flexors  5/5   Hip flexors  5/5   Hip extensors  5/5   Hip extensors  5/5   Knee flexors  5/5   Knee flexors  5/5   Knee extensors  5/5   Knee extensors  5/5   Dorsiflexors  5/5   Dorsiflexors  5/5   Plantarflexors  5/5   Plantarflexors  5/5   Toe extensors  5/5   Toe extensors  5/5   Toe flexors  5/5   Toe flexors  5/5   Tone (Ashworth scale)  0  Tone (Ashworth scale)  0   MSRs:  Right                                                                 Left brachioradialis 2+  brachioradialis 2+  biceps 2+  biceps 2+  triceps 2+  triceps 2+  patellar 2+  patellar 2+  ankle jerk 2+  ankle jerk 2+  Hoffman no  Hoffman no  plantar response down  plantar response down   SENSORY:  Normal and symmetric perception of light touch, pinprick, vibration, and proprioception.  Romberg's sign absent.   COORDINATION/GAIT: Normal finger-to- nose-finger and heel-to-shin.  Intact rapid alternating movements bilaterally.  Able to rise from a chair without using arms.  Gait narrow based and stable. Tandem and stressed gait intact.    IMPRESSION: Brittney [name] is a 58 year-old female presenting for evaluation of bilateral arm pain and dysesthesias.  Her neurological exam does not reveal and focal sensory or motor deficits.  Based on her history, it sounds like she may have ulnar neuropathy at the elbow, but there is a huge overlay of pain which seems to be out of proportion to what is typically seen in isolated ulnar neuropathy.  Therefore, there may be a superimposed arthritis or tendonitis also contributing to her pain.  I would like to repeat her EMG looking for nerve entrapment at the elbow and/or cervical radiculopathy and order neuropathy labs, including autoimmune diseases.  As I continue to  investigate her symptoms from a neurological standpoint, I want her to be seen by Dr. Tamala Julian for musculoskeletal ultrasound to evaluate for possible tendonitis especially with her long history of playing golf. Unfortunately, we are limited with pain medications because of intolerances to an exhuastive list of neuralgesic medications.  I offered her a trial of alpha-lipoic acid as there is some benefit in patients with neuropathy.     PLAN/RECOMMENDATIONS:  1.  Check 2-hr glucose tolerance, copper, SPEP/UPEP with IFE, RF, ANA, ENA 2.  EMG of the  bilateral arms 3.  Referral to Dr. Hulan Saas for musculoskeletal [place]  4.  Avoid hyperflexion at the elbow and repetitive pressure to the elbows 5.  Agree with Rheumatology consult 6.  Try alpha-lipoic acid 400-661m daily 7.  Return to clinic in 331-month  The duration of this appointment visit was 50 minutes of face-to-face time with the patient.  Greater than 50% of this time was spent in counseling, explanation of diagnosis, planning of further management, and coordination of care.   Thank you for allowing me to participate in patient's care.  If I can answer any additional questions, I would be pleased to do so.    Sincerely,    Donika K. PaPosey ProntoDO

## 2014-03-25 NOTE — Progress Notes (Signed)
Note faxed.

## 2014-03-25 NOTE — Patient Instructions (Addendum)
1.  Check blood work 2.  EMG of the bilateral arms 3.  Referral to Dr. Hulan Saas 4.  Avoid hyperflexion at the elbow and repetitive pressure to the elbows 5.  Agree with Rheumatology consult 6.  Try alpha-lipoic acid 400-600mg  daily 7.  Return to clinic in 51-months

## 2014-03-26 LAB — ENA 9 PANEL
Centromere Ab Screen: <1
ENA SM Ab Ser-aCnc: <1
Jo-1 Antibody, IgG: <1
Ribosomal P Protein Ab: <1
SCLERODERMA (SCL-70) (ENA) ANTIBODY, IGG: NEGATIVE
SM/RNP: <1
SSA (RO) (ENA) ANTIBODY, IGG: NEGATIVE
SSB (LA) (ENA) ANTIBODY, IGG: NEGATIVE
ds DNA Ab: <1 IU/mL

## 2014-03-26 LAB — RHEUMATOID FACTOR

## 2014-03-26 LAB — ANA: Anti Nuclear Antibody(ANA): NEGATIVE

## 2014-03-26 NOTE — Progress Notes (Signed)
Labs checked at Dr. Rhona Raider office received dated 01/23/19/15:  Folate >19.9, ESR 2, vitamin B12 372, CRP 1.2  Donika K. Posey Pronto, DO

## 2014-03-27 LAB — UIFE/LIGHT CHAINS/TP QN, 24-HR UR
Albumin, U: DETECTED
FREE LAMBDA LT CHAINS, UR: 0.07 mg/dL (ref 0.02–0.67)
Free Kappa Lt Chains,Ur: 0.72 mg/dL (ref 0.14–2.42)
Free Kappa/Lambda Ratio: 10.29 ratio (ref 2.04–10.37)
Total Protein, Urine: 1.3 mg/dL

## 2014-03-27 LAB — SPEP & IFE WITH QIG
ALPHA-2-GLOBULIN: 11 % (ref 7.1–11.8)
Albumin ELP: 67.8 % — ABNORMAL HIGH (ref 55.8–66.1)
Alpha-1-Globulin: 3.8 % (ref 2.9–4.9)
BETA 2: 3.6 % (ref 3.2–6.5)
BETA GLOBULIN: 5.9 % (ref 4.7–7.2)
Gamma Globulin: 7.9 % — ABNORMAL LOW (ref 11.1–18.8)
IGA: 41 mg/dL — AB (ref 69–380)
IGG (IMMUNOGLOBIN G), SERUM: 393 mg/dL — AB (ref 690–1700)
IgM, Serum: 281 mg/dL (ref 52–322)
Total Protein, Serum Electrophoresis: 6.2 g/dL (ref 6.0–8.3)

## 2014-03-27 LAB — COPPER, SERUM: COPPER: 113 ug/dL (ref 70–175)

## 2014-03-31 ENCOUNTER — Ambulatory Visit (INDEPENDENT_AMBULATORY_CARE_PROVIDER_SITE_OTHER): Payer: BC Managed Care – PPO | Admitting: Family Medicine

## 2014-03-31 ENCOUNTER — Other Ambulatory Visit (INDEPENDENT_AMBULATORY_CARE_PROVIDER_SITE_OTHER): Payer: BC Managed Care – PPO

## 2014-03-31 ENCOUNTER — Encounter: Payer: Self-pay | Admitting: Family Medicine

## 2014-03-31 VITALS — BP 108/78 | HR 74 | Ht 63.0 in | Wt 141.0 lb

## 2014-03-31 DIAGNOSIS — M79601 Pain in right arm: Secondary | ICD-10-CM

## 2014-03-31 DIAGNOSIS — M79609 Pain in unspecified limb: Secondary | ICD-10-CM

## 2014-03-31 DIAGNOSIS — M65849 Other synovitis and tenosynovitis, unspecified hand: Secondary | ICD-10-CM

## 2014-03-31 DIAGNOSIS — M6289 Other specified disorders of muscle: Secondary | ICD-10-CM | POA: Insufficient documentation

## 2014-03-31 DIAGNOSIS — R5381 Other malaise: Secondary | ICD-10-CM

## 2014-03-31 DIAGNOSIS — M65839 Other synovitis and tenosynovitis, unspecified forearm: Secondary | ICD-10-CM

## 2014-03-31 DIAGNOSIS — R5383 Other fatigue: Secondary | ICD-10-CM

## 2014-03-31 DIAGNOSIS — M778 Other enthesopathies, not elsewhere classified: Secondary | ICD-10-CM | POA: Insufficient documentation

## 2014-03-31 MED ORDER — LEVOTHYROXINE SODIUM 25 MCG PO TABS: 25.0000 ug | ORAL_TABLET | Freq: Every day | ORAL | Status: DC

## 2014-03-31 NOTE — Assessment & Plan Note (Signed)
Patient on ultrasound does have some nonspecific hypoechoic changes mostly of the tendon sheath. Patient is going to be referred to rheumatology for further workup which I think will be beneficial. At this time we are going to try some over-the-counter anti-inflammatory medications it could be very helpful. Patient does have a history of stomach discomfort which makes anti-inflammatories not an option. Patient will try bracing, home exercises, icing, and as that we have the discussed over-the-counter medications. I also think the patient's other symptomatology could point to possibly subacute thyroid versus depression. Patient was started on Synthroid and a very low dose. I think that this could be helpful in augmenting patient's symptoms. We also discussed about iron deficiency played a role. Patient will try all these interventions and come back again in 4 weeks for further evaluation and treatment.

## 2014-03-31 NOTE — Patient Instructions (Addendum)
Good to meet you Ice 20 minutes 2 times a day can help Exercises 3 times a week.  Brace day and night for 2 weeks then nightly 3 times a week.  Iron 325mg  daily Synthroid 25mg  daily.  Take tylenol 650 mg three times a day is the best evidence based medicine we have for arthritis.  Glucosamine sulfate 750mg  twice a day is a supplement that has been shown to help moderate to severe arthritis. Vitamin D 2000 IU daily Fish oil 2 grams daily.  Tumeric 500mg  twice daily Come back again after your [place]!!!!!!!!

## 2014-03-31 NOTE — Progress Notes (Signed)
[facility] [address] [place], [place] [address] Phone: [phone] Subjective:    I'm seeing this patient by the request  of:  Dr. Posey Pronto  CC: Right arm pain  Brittney [name] is a 58 y.o. female coming in with complaint of right arm pain. 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 Patient states since 2013 she continued to have an electrical tingling sensation that involved both arms more right greater than left of the fourth and fifth fingers. Patient states it seems to be worse when she tried to rest them on the table when she was working at her computer. Patient has tried icing with minimal benefit and is unable to take anti-inflammatories secondary to gastrointestinal side effects. Patient has had some lab testings including vitamin B12 and folate as well as inflammatory markers with no specific findings. Patient's workup also included a thyroid lab of a TSH of 2.9, MRI of the cervical spine only showing minimal degenerative changes. Patient was seen by neurology for further evaluation. On exam there was no focal findings and labs did not reveal any other findings including a normal glucose test, as well as inflammatory markers for rheumatoid arthritis. Patient also had a normal copper level. Patient was to start also lipoic acid which is not done yet. Refer to me as well as rheumatology for further evaluation for a possible tendinitis and other inflammatory conditions. Patient also has an EMG pending. Previous EMG was normal.      Past medical history, social, surgical and family history all reviewed in electronic medical record.   Review of Systems: No headache, visual changes, nausea,  vomiting, diarrhea, constipation, dizziness, abdominal pain, skin rash, fevers, chills, night sweats, weight loss, swollen lymph nodes, body aches, joint swelling, muscle aches, chest pain, shortness of breath, mood changes.   Objective Blood pressure 108/78, pulse 74, height 5\' 3"  (1.6 m), weight 141 lb (63.957 kg), last menstrual period 12/08/2011, SpO2 97.00%.  General: No apparent distress alert and oriented x3 mood and affect normal, dressed appropriately.  HEENT: Pupils equal, extraocular movements intact  Respiratory: Patient's speak in full sentences and does not appear short of breath  Cardiovascular: No lower extremity edema, non tender, no erythema  Skin: Warm dry intact with no signs of infection or rash on extremities or on axial skeleton.  Abdomen: Soft nontender  Neuro: Cranial nerves II through XII are intact, neurovascularly intact in all extremities with 2+ DTRs and 2+ pulses.  Lymph: No lymphadenopathy of posterior or anterior cervical chain or axillae bilaterally.  Gait normal with good balance and coordination.  MSK:  Non tender with full range of motion and good stability and symmetric strength and tone of shoulders,  hip, knee and ankles bilaterally. Neck: Inspection unremarkable. No palpable stepoffs. Negative Spurling's maneuver. Full neck range of motion Grip strength and sensation normal in bilateral hands Strength good C4 to T1 distribution No sensory change to C4 to T1 Negative Hoffman sign bilaterally Reflexes normal Elbow: Bilateral Unremarkable to inspection. Range of motion full pronation, supination, flexion, extension. Strength is full to all of the above directions Stable to varus, valgus stress. Negative moving valgus stress test. No discrete areas of tenderness to palpation. Ulnar nerve does not  sublux. Negative cubital tunnel Tinel's. Wrist: Right Inspection normal with no visible erythema or swelling. ROM smooth and normal with good flexion  and extension and ulnar/radial deviation that is symmetrical with opposite wrist. Palpation is normal over metacarpals, navicular, lunate, and TFCC; tendons without tenderness/ swelling patient does have generalized tenderness with palpation up the forearm but no specific findings  No snuffbox tenderness. No tenderness over Canal of Guyon. Strength 5/5 in all directions without pain. Negative Finkelstein, tinel's and phalens. Negative Watson's test.   MSK US performed of: Right wrist This study was ordered, performed, and interpreted by Charlann Boxer D.O.  Wrist: All extensor compartments visualized and tendons all normal in appearance without fraying, tears, or sheath effusions. No effusion seen. TFCC intact. Scapholunate ligament intact. Carpal tunnel visualized and median nerve area normal,  flexor tendons have significant amount of tendon sheath effusion. No specific tears noted but it seems to be generalized. Power doppler signal normal.  IMPRESSION: DIFFUSE FLEXOR TENDON SHEATH INFLAMMATION      Impression and Recommendations:     This case required medical decision making of moderate complexity.

## 2014-05-04 ENCOUNTER — Ambulatory Visit: Payer: BC Managed Care – PPO | Admitting: Family Medicine

## 2014-05-27 ENCOUNTER — Ambulatory Visit (INDEPENDENT_AMBULATORY_CARE_PROVIDER_SITE_OTHER): Payer: BC Managed Care – PPO | Admitting: Neurology

## 2014-05-27 DIAGNOSIS — M779 Enthesopathy, unspecified: Secondary | ICD-10-CM

## 2014-05-27 DIAGNOSIS — R209 Unspecified disturbances of skin sensation: Secondary | ICD-10-CM

## 2014-05-27 NOTE — Procedures (Signed)
[facility]  [address], [address]  [place], [place] [address] Tel: [phone] Fax:  [phone] Test Date:  05/27/2014  Patient: Brittney [name] DOB: 1956/09/02 Physician: Narda Amber, DO  Sex: Female Height: 5\' 3"  Ref Phys: Narda Amber  ID#: [id number] Temp; 34.2C Technician:    Patient Complaints: This is a 58 year-old female presenting with bilateral arm pain and dysesthesias.  NCV & EMG Findings: Extensive electrodiagnostic testing of the right upper extremity and additional studies of the left reveals:  1. Bilateral median, and ulnar sensory responses are within normal limits. The right radial, palmar studies, and dorsal ulnar cutaneous sensory responses are within normal limits. 2. Evaluation of the left ulnar motor and the right ulnar motor nerves showed decreased conduction velocity (A Elbow-B Elbow, L48, R45 m/s).  Bilateral median motor responses are within normal limits.  3. Mild chronic motor axon loss changes are seen affecting bilateral abductor digitorum minimi and flexor digitorum profundus 4&5 muscles, without accompanied active changes.   Impression: 1. 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 Overall, these findings are mild in degree electrically. 2. There is no evidence of a cervical motor radiculopathy or carpal tunnel syndrome affecting the upper extremities.   ___________________________ Narda Amber, DO    Nerve Conduction Studies Anti Sensory Summary Table   Site NR Peak (ms) Norm Peak (ms) P-T Amp (V) Norm P-T Amp  Right DorsCutan Anti Sensory (Dorsum 5th MC)  Wrist    1.6 <3.1 21.3 >10  Left Median Anti Sensory (2nd Digit)  Wrist    3.0 <3.6 32.8 >15  Right Median Anti Sensory (2nd Digit)  Wrist    2.8 <3.6 31.7 >15  Right Radial Anti Sensory (Base 1st Digit)  Wrist    2.2 <2.7 20.8 >14  Left Ulnar Anti Sensory (5th Digit)  Wrist    2.7 <3.1 23.3 >10  Right Ulnar Anti Sensory (5th  Digit)  Wrist    2.7 <3.1 27.1 >10   Motor Summary Table   Site NR Onset (ms) Norm Onset (ms) O-P Amp (mV) Norm O-P Amp Site1 Site2 Delta-0 (ms) Dist (cm) Vel (m/s) Norm Vel (m/s)  Left Median Motor (Abd Poll Brev)  Wrist    2.7 <4.0 7.2 >6 Elbow Wrist 4.7 27.0 57 >50  Elbow    7.4  7.0         Right Median Motor (Abd Poll Brev)  Wrist    3.2 <4.0 6.5 >6 Elbow Wrist 4.1 24.0 59 >50  Elbow    7.3  6.0         Left Ulnar Motor (Abd Dig Minimi)  Wrist    2.9 <3.1 9.7 >7 B Elbow Wrist 3.0 21.0 70 >50  B Elbow    5.9  8.9  A Elbow B Elbow 2.1 10.0 48 >50  A Elbow    8.0  8.5         Right Ulnar Motor (Abd Dig Minimi)  Wrist    2.0 <3.1 9.5 >7 B Elbow Wrist 3.1 21.0 68 >50  B Elbow    5.1  9.4  A Elbow B Elbow 2.2 10.0 45 >50  A Elbow    7.3  9.1         Right Ulnar (FDI) Motor (1st DI)  Wrist    3.7 <4.5 11.4 >7         Comparison Summary Table   Site NR Peak (ms) Norm Peak (ms) P-T Amp (V)  Site1 Site2 Delta-P (ms) Norm Delta (ms)  Right Median/Ulnar Palm Comparison (Wrist - 8cm)  Median Palm    1.8 <2.2 51.2 Median Palm Ulnar Palm 0.0   Ulnar Palm    1.8 <2.2 34.4       EMG   Side Muscle Ins Act Fibs Psw Fasc Number Recrt Dur Dur. Amp Amp. Poly Poly. Comment  Right 1stDorInt Nml Nml Nml Nml Nml Nml Nml Nml Nml Nml Nml Nml N/A  Right ABD Dig Min Nml Nml Nml Nml 1- Mod-V Few 1+ Nml Nml Nml Nml N/A  Right Ext Indicis Nml Nml Nml Nml Nml Nml Nml Nml Nml Nml Nml Nml N/A  Right FlexDigProf 4,5 Nml Nml Nml Nml 1- Mod-R Few 1+ Nml Nml Nml Nml N/A  Right PronatorTeres Nml Nml Nml Nml Nml Nml Nml Nml Nml Nml Nml Nml N/A  Right Triceps Nml Nml Nml Nml Nml Nml Nml Nml Nml Nml Nml Nml N/A  Right Deltoid Nml Nml Nml Nml Nml Nml Nml Nml Nml Nml Nml Nml N/A  Left 1stDorInt Nml Nml Nml Nml Nml Nml Nml Nml Nml Nml Nml Nml N/A  Left ABD Dig Min Nml Nml Nml Nml 1- Mod-R Few 1+ Few 1+ Nml Nml N/A  Left FlexDigProf 4,5 Nml Nml Nml Nml 1- Mod-V Few 1+ Nml Nml Nml Nml N/A  Left PronatorTeres Nml Nml  Nml Nml Nml Nml Nml Nml Nml Nml Nml Nml N/A  Left Triceps Nml Nml Nml Nml Nml Nml Nml Nml Nml Nml Nml Nml N/A      Waveforms:

## 2014-05-28 ENCOUNTER — Encounter: Payer: BC Managed Care – PPO | Admitting: Neurology

## 2014-05-29 ENCOUNTER — Ambulatory Visit (INDEPENDENT_AMBULATORY_CARE_PROVIDER_SITE_OTHER): Payer: BC Managed Care – PPO | Admitting: Family Medicine

## 2014-05-29 ENCOUNTER — Encounter: Payer: Self-pay | Admitting: Family Medicine

## 2014-05-29 VITALS — BP 104/68 | HR 79 | Ht 63.0 in | Wt 143.0 lb

## 2014-05-29 DIAGNOSIS — R5383 Other fatigue: Secondary | ICD-10-CM

## 2014-05-29 DIAGNOSIS — R5381 Other malaise: Secondary | ICD-10-CM

## 2014-05-29 DIAGNOSIS — M65849 Other synovitis and tenosynovitis, unspecified hand: Secondary | ICD-10-CM

## 2014-05-29 DIAGNOSIS — M778 Other enthesopathies, not elsewhere classified: Secondary | ICD-10-CM

## 2014-05-29 DIAGNOSIS — M65839 Other synovitis and tenosynovitis, unspecified forearm: Secondary | ICD-10-CM

## 2014-05-29 MED ORDER — LEVOTHYROXINE SODIUM 25 MCG PO TABS: 37.5000 ug | ORAL_TABLET | Freq: Every day | ORAL | Status: DC

## 2014-05-29 NOTE — Patient Instructions (Addendum)
Good to see you and congrats again Lets try B12 1021mcg daily Increase synthroid to 37.5mg  daily.  Continue the other medications Keep a food diary to see if there is any triggers.  Braces on at night.  Hopefully I will get those labs and they checked the calcium.  Otherwise next visit we may have to draw more blood.  Come back in 4-6 weeks.

## 2014-05-29 NOTE — Assessment & Plan Note (Signed)
Patient is improving slowly overall. Encourage her to continue the bracing. Continue the over-the-counter medications to help with the swelling. Patient will come back again in 4 weeks for further evaluation and treatment.

## 2014-05-29 NOTE — Progress Notes (Signed)
[name] [address] [place], [place] [address] Phone: [phone] Subjective:     CC: Right arm pain, follow up  EYC:[id number] Brittney [name] is a 58 y.o. female coming in with complaint of right arm pain. 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 Patient states since 2013 she continued to have an electrical tingling sensation that involved both arms more right greater than left of the fourth and fifth fingers. Patient states it seems to be worse when she tried to rest them on the table when she was working at her computer. Patient has tried icing with minimal benefit and is unable to take anti-inflammatories secondary to gastrointestinal side effects. Patient has had some lab testings including vitamin B12 and folate as well as inflammatory markers with no specific findings. Patient's workup also included a thyroid lab of a TSH of 2.9, MRI of the cervical spine only showing minimal degenerative changes. Patient was seen by neurology for further evaluation.   Patient was seen by me previously and ultrasound for the patient did have diffuse effusion of the tendon sheath of multiple flexor tendons. Patient states though she has been wearing bracing and doing the over-the-counter medications. Patient was also started on Synthroid and a very low dose. Next the states that the pain is approximately 30% better. Patient overall is feeling a little bit better and is happy with this. Patient is continuing to see her other providers including her neurologist as well as pain management. Patient has not been able to decrease her pain medicine over all yet. Patient states though that the aches and pains a daily basis is  starting to decrease which she is happy about. Denies any new symptoms. Patient was also seen by rheumatologist recently which he states had a negative workup. Patient also had an EMG and EMG showed very mild ulnar neuropathy bilaterally.     Past medical history, social, surgical and family history all reviewed in electronic medical record.   Review of Systems: No headache, visual changes, nausea, vomiting, diarrhea, constipation, dizziness, abdominal pain, skin rash, fevers, chills, night sweats, weight loss, swollen lymph nodes, body aches, joint swelling, muscle aches, chest pain, shortness of breath, mood changes.   Objective Blood pressure 104/68, pulse 79, height 5\' 3"  (1.6 m), weight 143 lb (64.864 kg), last menstrual period 12/08/2011, SpO2 97.00%.  General: No apparent distress alert and oriented x3 mood and affect normal, dressed appropriately.  HEENT: Pupils equal, extraocular movements intact  Respiratory: Patient's speak in full sentences and does not appear short of breath  Cardiovascular: No lower extremity edema, non tender, no erythema  Skin: Warm dry intact with no signs of infection or rash on extremities or on axial skeleton.  Abdomen: Soft nontender  Neuro: Cranial nerves II through XII are intact, neurovascularly intact in all extremities with 2+ DTRs and 2+ pulses.  Lymph: No lymphadenopathy of posterior or anterior cervical chain or axillae bilaterally.  Gait normal with good balance and coordination.  MSK:  Non tender with full range of motion and good stability and symmetric strength and tone of shoulders,  hip, knee and ankles bilaterally. Neck: Inspection unremarkable. No palpable stepoffs. Negative Spurling's maneuver. Full neck range of motion Grip strength and sensation  normal in bilateral hands Strength good C4 to T1 distribution No sensory change to C4 to T1 Negative Hoffman sign bilaterally Reflexes normal Elbow: Bilateral Unremarkable to  inspection. Range of motion full pronation, supination, flexion, extension. Strength is full to all of the above directions Stable to varus, valgus stress. Negative moving valgus stress test. No discrete areas of tenderness to palpation. Ulnar nerve does not sublux. Negative cubital tunnel Tinel's. Wrist: Right Inspection normal with no visible erythema or swelling. ROM smooth and normal with good flexion and extension and ulnar/radial deviation that is symmetrical with opposite wrist. Palpation is normal over metacarpals, navicular, lunate, and TFCC; tendons without tenderness/ swelling patient does have generalized tenderness with palpation up the forearm but no specific findings less tender than previous exam No snuffbox tenderness. No tenderness over Canal of Guyon. Strength 5/5 in all directions without pain. Negative Finkelstein, tinel's and phalens. Negative Watson's test.   MSK US performed of: Right wrist This study was ordered, performed, and interpreted by Charlann Boxer D.O.  Wrist: All extensor compartments visualized and tendons all normal in appearance without fraying, tears, or sheath effusions. No effusion seen. TFCC intact. Scapholunate ligament intact. Carpal tunnel visualized and median nerve area normal,  flexor tendons have still moderate amount of tendon sheath effusion. No specific tears noted but it seems to be generalized. Power doppler signal normal.  IMPRESSION: Significantly less inflammation of the flexor tendons.      Impression and Recommendations:     This case required medical decision making of moderate complexity.

## 2014-05-29 NOTE — Assessment & Plan Note (Addendum)
Discussed with patient at great length again. Patient has seen some results with the Synthroid and we did increase her to 37.5 mg. In addition to this we discussed B12 supplementation with patient's last B12 level been 395 We discussed also keep no fluids diary to see if there is anything else that could be contributing to this and if any inflammatory foods could be causing some difficulty. Patient will try these and come back and see me again in 4-6 weeks. At that time if continuing to have trouble patient has had a history of hypocalcemia and I would consider further workup including a calcium level, parathyroid, and thyroid  Spent greater than 25 minutes with patient face-to-face and had greater than 50% of counseling including as described above in assessment and plan.

## 2014-06-25 ENCOUNTER — Encounter: Payer: Self-pay | Admitting: Neurology

## 2014-06-25 ENCOUNTER — Ambulatory Visit (INDEPENDENT_AMBULATORY_CARE_PROVIDER_SITE_OTHER): Payer: BC Managed Care – PPO | Admitting: Neurology

## 2014-06-25 VITALS — BP 110/68 | HR 69 | Ht 63.0 in | Wt 145.1 lb

## 2014-06-25 DIAGNOSIS — F32A Depression, unspecified: Secondary | ICD-10-CM

## 2014-06-25 DIAGNOSIS — M791 Myalgia, unspecified site: Secondary | ICD-10-CM

## 2014-06-25 DIAGNOSIS — IMO0001 Reserved for inherently not codable concepts without codable children: Secondary | ICD-10-CM

## 2014-06-25 DIAGNOSIS — F3289 Other specified depressive episodes: Secondary | ICD-10-CM

## 2014-06-25 DIAGNOSIS — F329 Major depressive disorder, single episode, unspecified: Secondary | ICD-10-CM

## 2014-06-25 DIAGNOSIS — M779 Enthesopathy, unspecified: Secondary | ICD-10-CM

## 2014-06-25 NOTE — Progress Notes (Signed)
Follow-up Visit   Date: 06/26/2014    Brittney [name] MRN: [id number] DOB: 07/07/1956   Interim History: Brittney [name] is a 58 y.o. right-handed Caucasian female with history of depression, GERD, right trigeminal neuralgia s/p RFA (followed by Dr. Vertell Limber), right CTS s/p release, and migraines returning to the clinic for follow-up of bilateral (right > left) arm pain.    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 Following surgery, her left hand numbness/tingling resolved. She stopped playing golf in 2006 and switched to photography. Starting around ~2013, she developed electrical tingling/numbness involving the medial forearm and last two digits affecting both arms. Symptoms are worse when she rested her arms on surfaces (sofa, table, etc) or flexing her elbows such as when using her camera. Extending her arms improves her paresthesias within a few minutes. She has also tried icing her arms which can help. Numbness and tingling is not constant, but she has a great deal of joint pain (neck, shoulders, elbow, hands, low back, R knee). She complains of a lot of morning joint stiffness and pain. She has benefit with NSAIDs, cannot take them due to GI upset.   She has been seeing Dr. Daylene Katayama for possible ulnar neuropathy after electrodiagnostic testing performed June 2014 showed very mild slowing across the left elbow. Due to persistent and ongoing discomfort of the lateral aspect of the arms, she has tried a number of pain medications and occupational therapy without any improvement. Vitamin B12, folate, ESR, and CRP was within normal limits.   Of note, she also has history of right trigeminal neuralgia affecting very localized region of the right supraorbital ridge and has has radiofrequency ablation for the pain without success. She has previously tried neurontin, TCAs, Lyrica,  cymbalta, venlafaxine, carbamazepine, oxcarbamazepine, lamictal, and dilantin which were stopped due to side effects/intolerance. Currently, she sees Dr. Maryjean Ka, pain management, who has performed a number of nerve blocks for trigeminal neuralgia and back pain.   UPDATE 06/25/2014:  She underwent EMG which showed residuals of bilateral ulnar neuropathy at the elbow.  Due to concern of tendonitis, she was referred to Dr Tamala Julian whose interventions of taking supplements and using wrist splint has provided the greatest relief, especially on the right hand.  She also saw Rheumatology whose evaluation did not reveal any signs of inflammatory arthritis.   She continues to complain of generalized pain and discomfort of her shoulders, neck, arms, and knees. Pain is reproduced with palpation over the muscles.  She has a shooting pain from her shoulder on the left that goes into her arms.  EMG or MRI c-spine does not show any nerve impingement.  She had similar symptoms on the right in 2014 which after months of injections and medications eventually resolved.   She occasionally gets stucks in position with arms flexed in front of her, and she has to physically push her arms back. Physical therapy does not help.  Further, she says her depression has been waxing and waning and symptoms are always worse when her mood is down.   Medications:  Current Outpatient Prescriptions on File Prior to Visit  Medication Sig Dispense Refill  . buPROPion (WELLBUTRIN XL) 300 MG 24 hr tablet Take 300 mg by mouth daily.      Marland Kitchen CALCIUM PO Take by mouth. Occasionally calcium and magnesium      . cyclobenzaprine (FLEXERIL) 10  MG tablet 10 mg.      . escitalopram (LEXAPRO) 20 MG tablet Take 20 mg by mouth daily.        Marland Kitchen HYDROcodone-acetaminophen (NORCO) 10-325 MG per tablet Take 1 tablet by mouth every 6 (six) hours as needed.      Marland Kitchen levothyroxine (SYNTHROID) 25 MCG tablet Take 1.5 tablets (37.5 mcg total) by mouth daily before  breakfast.  45 tablet  1  . LORazepam (ATIVAN) 0.5 MG tablet Take 0.5 mg by mouth every 8 (eight) hours.        . Multiple Vitamins-Minerals (MULTIVITAMIN PO) Take by mouth. Occasionally      . Omeprazole-Sodium Bicarbonate (ZEGERID) 20-1100 MG CAPS capsule       . promethazine (PHENERGAN) 25 MG tablet       . SUMAtriptan (IMITREX) 25 MG tablet Take 25 mg by mouth every 2 (two) hours as needed for migraine.      . VOLTAREN 1 % GEL       . zolpidem (AMBIEN) 10 MG tablet Take 10 mg by mouth at bedtime as needed.         No current facility-administered medications on file prior to visit.    Allergies:  Allergies  Allergen Reactions  . Asa Arthritis Strength-Antacid [Aspirin Buffered]   . Carbamazepine   . Celebrex [Celecoxib] Other (See Comments)    Stomach pain  . Cephalexin   . Codeine   . Cymbalta [Duloxetine Hcl]   . Darvocet [Propoxyphene N-Acetaminophen]   . Dexilant [Dexlansoprazole]   . Diazepam Other (See Comments)    migraines  . Dilantin [Phenytoin Sodium Extended] Other (See Comments)    Zombie like feeling  . Klonopin [Clonazepam] Other (See Comments)    Makes her feel like a zombie  . Lamictal [Lamotrigine]   . Lyrica [Pregabalin]   . Neurontin [Gabapentin]   . Nsaids     Gi upset  . Nubain [Nalbuphine Hcl]     Spasms-itching  . Nubain [Nalbuphine Hcl]     Muscle spasms   . Oxcarbazepine     Increase depression/severe constipation  . Prednisone Other (See Comments)    Headaches  . Pregabalin     Severe depression, severe edema  . Thorazine [Chlorpromazine Hcl]      Review of Systems:  CONSTITUTIONAL: No fevers, chills, night sweats, or weight loss.   EYES: No visual changes or eye pain ENT: No hearing changes.  No history of nose bleeds.   RESPIRATORY: No cough, wheezing and shortness of breath.   CARDIOVASCULAR: Negative for chest pain, and palpitations.   GI: Negative for abdominal discomfort, blood in stools or black stools.  No recent change in  bowel habits.  GU:  No history of incontinence.   MUSCLOSKELETAL: +history of joint pain or swelling.  +myalgias.   SKIN: Negative for lesions, rash, and itching.   ENDOCRINE: Negative for cold or heat intolerance, polydipsia or goiter.   PSYCH:  + depression or anxiety symptoms.   NEURO: As Above.   Vital Signs:  BP 110/68  Pulse 69  Ht '5\' 3"'  (1.6 m)  Wt 145 lb 2 oz (65.828 kg)  BMI 25.71 kg/m2  SpO2 99%  LMP 12/08/2011  Neurological Exam: MENTAL STATUS including orientation to time, place, person, recent and remote memory, attention span and concentration, language, and fund of knowledge is normal.  Speech is not dysarthric.  CRANIAL NERVES: Pupils equal round and reactive to light.  Face is symmetric. Palate elevates symmetrically.  MOTOR:  Motor strength is 5/5 in all extremities. There is pain to palpation over over the cervical paraspinal muscles, shoulder girdle, and elbows.  Tone is normal.    MSRs:  Reflexes are 2+/4 throughout  SENSORY:  Intact to light touch  COORDINATION/GAIT:   Gait narrow based and stable.   Data: EMG 05/27/2014: Impression:  1. 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 Overall, these findings are mild in degree electrically. 2. There is no evidence of a cervical motor radiculopathy or carpal tunnel syndrome affecting the upper extremities.  Labs 10/25/2013: TSH 2.958  Labs 03/25/2014:  Copper 113, SPEP/UPEP with IFE no M protein, RF neg, ANA neg, ENA neg  MRI cervical spine 02/12/2013: Minimal to mild degenerative changes most prominent C4-5 level as detailed above.   MRI brain wwo contrast 08/15/2011:  1. Negative trigeminal nerve imaging.  2. Stable, normal MRI appearance of the brain.  3. Incidental sebaceous cyst type lesion superficial to the right parotid gland.    IMPRESSION/PLAN: Ms. Hand is a 58 year-old female returning for evaluation of bilateral arm pain and dysesthesias. Her  neurological exam is normal.  MRI cervical spine, EMG, and serology testing for inflammatory conditions has been nondiagnostic, except for mild bilateral ulnar neuropathy at the elbow.  Her pain and symptoms is beyond the distribution of the ulnar nerve.  Her evaluation with Dr. Tamala Julian has shown tendonitis and she is getting symptomatic benefit from wrist splint and tumeric powder.    Unfortunately, at this juncture, I do not have much to offer the patient.  She has a lot of pain complaints which we discussed at legnth, including trigeminal neuralgia.  She has previously tried neurontin, TCAs, Lyrica, cymbalta, venlafaxine, carbamazepine, oxcarbamazepine, lamictal, and dilantin which were stopped due to side effects/intolerance. She is not interested in radiofrequency ablation.  I encouraged her to stay active, continue to use wrist splints, and offered counseling services for her depression since untreated mood disorder will only exacerbate pain symptoms, but she is not interested in therapy at this time.  She is scheduled to follow-up with Dr. Tamala Julian for musculoskeletal pain and discomfort and is welcome to return to clinic as needed.   The duration of this appointment visit was 35 minutes of face-to-face time with the patient.  Greater than 50% of this time was spent in counseling, explanation of diagnosis, planning of further management, and coordination of care.   Thank you for allowing me to participate in patient's care.  If I can answer any additional questions, I would be pleased to do so.    Sincerely,    Piper Albro K. Posey Pronto, DO

## 2014-07-10 ENCOUNTER — Encounter: Payer: Self-pay | Admitting: Family Medicine

## 2014-07-10 ENCOUNTER — Other Ambulatory Visit (INDEPENDENT_AMBULATORY_CARE_PROVIDER_SITE_OTHER): Payer: BC Managed Care – PPO

## 2014-07-10 ENCOUNTER — Ambulatory Visit (INDEPENDENT_AMBULATORY_CARE_PROVIDER_SITE_OTHER): Payer: BC Managed Care – PPO | Admitting: Family Medicine

## 2014-07-10 VITALS — BP 102/62 | HR 70 | Ht 63.0 in | Wt 145.0 lb

## 2014-07-10 DIAGNOSIS — G5620 Lesion of ulnar nerve, unspecified upper limb: Secondary | ICD-10-CM | POA: Insufficient documentation

## 2014-07-10 DIAGNOSIS — M6289 Other specified disorders of muscle: Secondary | ICD-10-CM

## 2014-07-10 DIAGNOSIS — G5622 Lesion of ulnar nerve, left upper limb: Secondary | ICD-10-CM

## 2014-07-10 DIAGNOSIS — G5621 Lesion of ulnar nerve, right upper limb: Secondary | ICD-10-CM

## 2014-07-10 DIAGNOSIS — G5623 Lesion of ulnar nerve, bilateral upper limbs: Secondary | ICD-10-CM

## 2014-07-10 DIAGNOSIS — M5030 Other cervical disc degeneration, unspecified cervical region: Secondary | ICD-10-CM

## 2014-07-10 LAB — T4, FREE: Free T4: 0.91 ng/dL (ref 0.60–1.60)

## 2014-07-10 LAB — FERRITIN: Ferritin: 38.1 ng/mL (ref 10.0–291.0)

## 2014-07-10 LAB — T3, FREE: T3 FREE: 2.7 pg/mL (ref 2.3–4.2)

## 2014-07-10 LAB — TSH: TSH: 0.42 u[IU]/mL (ref 0.35–4.50)

## 2014-07-10 LAB — VITAMIN B12: VITAMIN B 12: 1112 pg/mL — AB (ref 211–911)

## 2014-07-10 NOTE — Assessment & Plan Note (Signed)
Patient's fatigue is likely multifactorial. I do think underlying depression is playing a role in patient is seen in provider for this. Patient is also going to a pain management doctor as well. I do think that patient has made some strides but at this time she has decreased doing the amount of exercise she was doing previously. Encourage her to start doing this a more regular basis. We discussed increasing the dose of vitamin D which I think will be beneficial. We discussed continuing the other over-the-counter medications. Patient needs to find some other type motivation that I think will be helpful as well. I would like to check labs to make sure there is no underlying pathology such as hyperparathyroidism or continued hypothyroidism. We will also check his patient supplementation and B12 has been beneficial with rechecking labs today. Patient then will come back and we'll discuss further. Patient does have some cervical neck disease that could be also contributing. We may want to consider a diagnostic as well as therapeutic epidural steroid injection into the cervical neck. Patient has not had one in her neck previously but has had some of them back previously. We will need to continue did discuss. Discussed icing regimen and lidocaine cause some improvement. Patient also has underlying osteoarthritis and we discussed the importance of proper diet as well as weightbearing exercise. Patient will make all these changes and come back and see me again in 3-4 weeks.  Spent greater than 25 minutes with patient face-to-face and had greater than 50% of counseling including as described above in assessment and plan.

## 2014-07-10 NOTE — Patient Instructions (Signed)
Good to see you Brittney [name] is your friend Start the exercises again.  Lets get labs today make sure everything is normal .  Continue the supplements. Increase the vitamin D 4000IU total daily I will call you with the labs.  If normal we will consider injection in the neck. Otherwise we will talk on the phone.

## 2014-07-10 NOTE — Progress Notes (Signed)
[facility] [address] [place], [place] [address] Phone: [phone] Subjective:     CC: Right arm pain, follow up  [id number] Brittney [name] is a 58 y.o. female coming in with complaint of right arm pain.    Patient states since 2013 she continued to have an electrical tingling sensation that involved both arms more right greater than left of the fourth and fifth fingers. Patient states it seems to be worse when she tried to rest them on the table when she was working at her computer. Patient has tried icing with minimal benefit and is unable to take anti-inflammatories secondary to gastrointestinal side effects.  Patient has had some lab testings including vitamin B12 and folate as well as inflammatory markers with no specific findings. Patient's workup also included a thyroid lab of a TSH of 2.9, MRI of the cervical spine only showing minimal degenerative changes. Patient was seen by neurology for further evaluation.  Patient also had an EMG and EMG showed very mild ulnar neuropathy bilaterally. Patient has neck MRI from 2013 showing some moderate degenerative changes at C4-C5 as well as C6-C7.  Patient was continuing to have nonspecific swelling of her flexor tendons of her wrist. Patient states that this seems to be more secondary to her ulnar neuropathy and no longer has the pain that she's had previously. Patient still states that as a dull aching sensation. Patient states that she does not think she is making any significant improvement. Patient does have some of her underlying depression seems to make this worse and patient hasn't also been working out as frequently as usual.     Past medical history, social, surgical and family history all reviewed in electronic medical record.   Review of Systems: No headache, visual changes, nausea, vomiting, diarrhea, constipation, dizziness, abdominal pain, skin rash, fevers, chills, night sweats, weight  loss, swollen lymph nodes, body aches, joint swelling, muscle aches, chest pain, shortness of breath, mood changes.   Objective Blood pressure 102/62, pulse 70, height 5\' 3"  (1.6 m), weight 145 lb (65.772 kg), last menstrual period 12/08/2011, SpO2 98.00%.  General: No apparent distress alert and oriented x3 mood and affect normal, dressed appropriately.  HEENT: Pupils equal, extraocular movements intact  Respiratory: Patient's speak in full sentences and does not appear short of breath  Cardiovascular: No lower extremity edema, non tender, no erythema  Skin: Warm dry intact with no signs of infection or rash on extremities or on axial skeleton.  Abdomen: Soft nontender  Neuro: Cranial nerves II through XII are intact, neurovascularly intact in all extremities with 2+ DTRs and 2+ pulses.  Lymph: No lymphadenopathy of posterior or anterior cervical chain or axillae bilaterally.  Gait normal with good balance and coordination.  MSK:  Non tender with full range of motion and good stability and symmetric strength and tone of shoulders,  hip, knee and ankles bilaterally. Neck: Inspection unremarkable. No palpable stepoffs. Negative Spurling's maneuver. Full neck range of motion Grip strength and sensation normal in bilateral hands Strength good C4 to T1 distribution No sensory change to C4 to T1 Negative Hoffman sign bilaterally Reflexes normal Elbow: Bilateral Unremarkable to inspection. Range of motion full pronation, supination, flexion, extension. Strength is full to all of the above directions Stable to varus, valgus stress. Negative moving valgus stress test. No discrete areas of tenderness to palpation. Ulnar nerve does not sublux. Negative cubital tunnel Tinel's. Wrist: Right Inspection normal with no visible erythema or swelling. ROM  smooth and normal with good flexion and extension and ulnar/radial deviation that is symmetrical with opposite wrist. Palpation is normal over  metacarpals, navicular, lunate, and TFCC; tendons without tenderness/ swelling patient does have generalized tenderness with palpation up the forearm but no specific findings less tender than previous exam No snuffbox tenderness. No tenderness over Canal of Guyon. Strength 5/5 in all directions without pain. Negative Finkelstein, tinel's and phalens. Negative Watson's test.  No change from previous exam.     Impression and Recommendations:     This case required medical decision making of moderate complexity.

## 2014-07-11 LAB — CALCIUM, IONIZED: Calcium, Ion: 1.3 mmol/L (ref 1.12–1.32)

## 2014-07-13 LAB — PTH, INTACT AND CALCIUM
Calcium: 9.7 mg/dL (ref 8.4–10.5)
PTH: 39 pg/mL (ref 14–64)

## 2014-07-16 LAB — PTH-RELATED PEPTIDE: PTH-Related Protein (PTH-RP): 15 pg/mL (ref 14–27)

## 2014-07-24 ENCOUNTER — Ambulatory Visit (INDEPENDENT_AMBULATORY_CARE_PROVIDER_SITE_OTHER): Payer: BC Managed Care – PPO | Admitting: Family Medicine

## 2014-07-24 ENCOUNTER — Encounter: Payer: Self-pay | Admitting: Family Medicine

## 2014-07-24 VITALS — BP 110/62 | HR 64 | Ht 63.0 in | Wt 141.0 lb

## 2014-07-24 DIAGNOSIS — M5030 Other cervical disc degeneration, unspecified cervical region: Secondary | ICD-10-CM

## 2014-07-24 DIAGNOSIS — F32A Depression, unspecified: Secondary | ICD-10-CM | POA: Insufficient documentation

## 2014-07-24 DIAGNOSIS — F329 Major depressive disorder, single episode, unspecified: Secondary | ICD-10-CM

## 2014-07-24 NOTE — Patient Instructions (Signed)
Good to see you We will hold on the epidural.  Lets get back to activity  Consider stopping the calcium and increase vitamin D totoal of 5000 IU daily.  When working out make sure plenty of protein afterward. Whey protein isolate. Of pain worsens call me and we will consider the epidural.  Love the remeron  See me again in 4 weeks.

## 2014-07-24 NOTE — Assessment & Plan Note (Addendum)
I believe the patient does have degenerative disc disease of the neck is likely giving her more of the ulnar neuropathy that has been seen on the EMG. We discussed the possibility of having a epidural steroid injection but I think that also her underlying depression is also causing some concern. Discussed with patient at this time I like to do conservative therapy still. Patient will call back if the pain seems to be too much and we will actually start patient on a cervical epidural steroid injection to see if we get some benefit. Patient has had one greater than one year ago with no significant improvement. I also discussed at this time and increasing her vitamin D supplementation may help with that elevated ionized calcium. Continue to monitor her PTH if she continues to have difficulties. Patient's calcium level is high as that has been at 9.7 patient had recently had significant low calcium. We discussed different nutritional guide changes the can also be beneficial. Patient will follow up again in 2-3 weeks to make sure she continues to improve.  Spent greater than 25 minutes with patient face-to-face and had greater than 50% of counseling including as described above in assessment and plan.

## 2014-07-24 NOTE — Progress Notes (Signed)
[name] [address] [place], [place] [address] Phone: [phone] Subjective:     CC: Right arm pain, follow up  YIR:[id number] Brittney [name] is a 58 y.o. female coming in with complaint of right arm pain.    Patient states since 2013 she continued to have an electrical tingling sensation that involved both arms more right greater than left of the fourth and fifth fingers. Patient states it seems to be worse when she tried to rest them on the table when she was working at her computer. Patient has tried icing with minimal benefit and is unable to take anti-inflammatories secondary to gastrointestinal side effects.   Patient has had some lab testings including vitamin B12 and folate as well as inflammatory markers with no specific findings. Patient's workup also included a thyroid lab of a TSH of 2.9, most recent value though a 0.46  MRI of the cervical spine only showing minimal degenerative changes. Patient was seen by neurology for further evaluation.  Patient also had an EMG and EMG showed very mild ulnar neuropathy bilaterally. Patient has neck MRI from 2013 showing some moderate degenerative changes at C4-C5 as well as C6-C7. We did check some other labs recently which were unremarkable except for an ionized calcium at 1.30 which is the high end of normal. Patient continues to have difficulty with depression as well. Patient did see a therapist and we have started Remeron. This will hopefully help with patient sleep as well. Patient is still having dull aching pain and has not started her regular activities and knows that that would be more beneficial when she came. Patient is becoming more motivated.       Past medical history, social, surgical and family history all reviewed in electronic medical record.   Review of Systems: No headache, visual changes, nausea, vomiting, diarrhea, constipation, dizziness, abdominal pain, skin rash, fevers,  chills, night sweats, weight loss, swollen lymph nodes, body aches, joint swelling, muscle aches, chest pain, shortness of breath, mood changes.   Objective Blood pressure 110/62, pulse 64, height 5\' 3"  (1.6 m), weight 141 lb (63.957 kg), last menstrual period 12/08/2011, SpO2 98.00%.  General: No apparent distress alert and oriented x3 mood and affect normal, dressed appropriately.  HEENT: Pupils equal, extraocular movements intact  Respiratory: Patient's speak in full sentences and does not appear short of breath  Cardiovascular: No lower extremity edema, non tender, no erythema  Skin: Warm dry intact with no signs of infection or rash on extremities or on axial skeleton.  Abdomen: Soft nontender  Neuro: Cranial nerves II through XII are intact, neurovascularly intact in all extremities with 2+ DTRs and 2+ pulses.  Lymph: No lymphadenopathy of posterior or anterior cervical chain or axillae bilaterally.  Gait normal with good balance and coordination.  MSK:  Non tender with full range of motion and good stability and symmetric strength and tone of shoulders,  hip, knee and ankles bilaterally. Neck: Inspection unremarkable. No palpable stepoffs. Negative Spurling's maneuver. Near full range of motion Grip strength and sensation normal in bilateral hands Strength good C4 to T1 distribution No sensory change to C4 to T1 Negative Hoffman sign bilaterally Reflexes normal Elbow: Bilateral Unremarkable to inspection. Range of motion full pronation, supination, flexion, extension. Strength is full to all of the above directions Stable to varus, valgus stress. Negative moving valgus stress test. No discrete areas of tenderness to palpation. Ulnar nerve does not sublux. Negative cubital tunnel Tinel's. Wrist: Right Inspection  normal with no visible erythema or swelling. ROM smooth and normal with good flexion and extension and ulnar/radial deviation that is symmetrical with opposite  wrist. Palpation is normal over metacarpals, navicular, lunate, and TFCC; tendons without tenderness/ swelling patient does have generalized tenderness with palpation up the forearm but no specific findings less tender than previous exam No snuffbox tenderness. No tenderness over Canal of Guyon. Strength 5/5 in all directions without pain. Negative Finkelstein, tinel's and phalens. Negative Watson's test.  No change from previous exam.   Impression and Recommendations:     This case required medical decision making of moderate complexity.

## 2014-07-27 ENCOUNTER — Other Ambulatory Visit: Payer: Self-pay | Admitting: Family Medicine

## 2014-07-27 NOTE — Telephone Encounter (Signed)
Refill done.  

## 2014-08-25 ENCOUNTER — Ambulatory Visit: Payer: BC Managed Care – PPO | Admitting: Family Medicine

## 2014-09-22 ENCOUNTER — Ambulatory Visit: Payer: Self-pay | Admitting: Family Medicine

## 2014-10-20 ENCOUNTER — Encounter: Payer: Self-pay | Admitting: Family Medicine

## 2014-10-20 ENCOUNTER — Ambulatory Visit (INDEPENDENT_AMBULATORY_CARE_PROVIDER_SITE_OTHER): Payer: BLUE CROSS/BLUE SHIELD | Admitting: Family Medicine

## 2014-10-20 VITALS — BP 108/68 | HR 60 | Ht 63.0 in | Wt 153.0 lb

## 2014-10-20 DIAGNOSIS — F329 Major depressive disorder, single episode, unspecified: Secondary | ICD-10-CM

## 2014-10-20 DIAGNOSIS — F32A Depression, unspecified: Secondary | ICD-10-CM

## 2014-10-20 DIAGNOSIS — M5030 Other cervical disc degeneration, unspecified cervical region: Secondary | ICD-10-CM

## 2014-10-20 DIAGNOSIS — G894 Chronic pain syndrome: Secondary | ICD-10-CM

## 2014-10-20 NOTE — Assessment & Plan Note (Signed)
Patient has improved with conservative therapy. Patient still has some mild tightness but overall does have full range of motion and no longer having significant radiculopathy. Patient has any exacerbation my first step would be an epidural steroid injection of the cervical spine.

## 2014-10-20 NOTE — Progress Notes (Signed)
[name] [address] [place], [place] [address] Phone: [phone] Subjective:     CC: Right arm pain, follow up  XNA:[id number] Brittney [name] is a 59 y.o. female coming in with complaint of right arm pain.    Patient states since 2013 she continued to have an electrical tingling sensation that involved both arms more right greater than left of the fourth and fifth fingers. Patient states it seems to be worse when she tried to rest them on the table when she was working at her computer. Patient has tried icing with minimal benefit and is unable to take anti-inflammatories secondary to gastrointestinal side effects.   Patient has had some lab testings including vitamin B12 and folate as well as inflammatory markers with no specific findings. Patient's workup also included a thyroid lab of a TSH of 2.9, most recent value though a 0.46  MRI of the cervical spine only showing minimal degenerative changes. Patient was seen by neurology for further evaluation.  Patient also had an EMG and EMG showed very mild ulnar neuropathy bilaterally. Patient has neck MRI from 2013 showing some moderate degenerative changes at C4-C5 as well as C6-C7. Patient though has been treated recently for depression. Patient states that since she's been on the proper treatment she has scant some weight but overall is feeling significantly better. Patient has been more active and has started going to the gym on a more regular basis. Patient has been feeling much better overall. Patient states that she still has some chronic dull aching pain but nothing that is keeping her from significant activity. Patient states that the muscle tiredness has actually improved a little bit as well. Patient is fairly happy with the results. Continuing the vitamin supplementation. Continuing the Celebrex.      Past medical history, social, surgical and family history all reviewed in electronic medical  record.   Review of Systems: No headache, visual changes, nausea, vomiting, diarrhea, constipation, dizziness, abdominal pain, skin rash, fevers, chills, night sweats, weight loss, swollen lymph nodes, body aches, joint swelling, muscle aches, chest pain, shortness of breath, mood changes.   Objective Blood pressure 108/68, pulse 60, height 5\' 3"  (1.6 m), weight 153 lb (69.4 kg), last menstrual period 12/08/2011, SpO2 98 %.  General: No apparent distress alert and oriented x3 mood and affect normal, dressed appropriately.  HEENT: Pupils equal, extraocular movements intact  Respiratory: Patient's speak in full sentences and does not appear short of breath  Cardiovascular: No lower extremity edema, non tender, no erythema  Skin: Warm dry intact with no signs of infection or rash on extremities or on axial skeleton.  Abdomen: Soft nontender  Neuro: Cranial nerves II through XII are intact, neurovascularly intact in all extremities with 2+ DTRs and 2+ pulses.  Lymph: No lymphadenopathy of posterior or anterior cervical chain or axillae bilaterally.  Gait normal with good balance and coordination.  MSK:  Non tender with full range of motion and good stability and symmetric strength and tone of shoulders,  hip, knee and ankles bilaterally. Neck: Inspection unremarkable. No palpable stepoffs. Negative Spurling's maneuver. Near full range of motion Grip strength and sensation normal in bilateral hands Strength good C4 to T1 distribution No sensory change to C4 to T1 Negative Hoffman sign bilaterally Reflexes normal Elbow: Bilateral Unremarkable to inspection. Range of motion full pronation, supination, flexion, extension. Strength is full to all of the above directions Stable to varus, valgus stress. Negative moving valgus stress test.  No discrete areas of tenderness to palpation. Ulnar nerve does not sublux. Negative cubital tunnel Tinel's. Wrist: Right Inspection normal with no visible  erythema or swelling. ROM smooth and normal with good flexion and extension and ulnar/radial deviation that is symmetrical with opposite wrist. Palpation is normal over metacarpals, navicular, lunate, and TFCC; tendons without tenderness/ swelling nontender on exam  No snuffbox tenderness. No tenderness over Canal of Guyon. Strength 5/5 in all directions without pain. Negative Finkelstein, tinel's and phalens. Negative Watson's test.  Improvement from previous exam.   Impression and Recommendations:     This case required medical decision making of moderate complexity.

## 2014-10-20 NOTE — Assessment & Plan Note (Signed)
Do believe that patient's underlying arthritis as well as chronic pain syndrome and depression overall length. We discussed continuing the medications because patient has actually made improvement over the course last several weeks. Patient has gained weight but isn't significant better mood and has been more active. I think overall she will continue to be more active and she will do well. Patient has any exacerbation of any of her pain we can only prednisone and other anti-inflammatories as needed. We like to avoid these as much as we can do to her history of having somewhat. Patient does have an MRI of her neck showing some degenerative disc disease mostly at the C6-C7 level. Patient has any radicular symptoms or continue we would consider an epidural. Patient was doing well and her back to check in with patient again in 6 weeks for further evaluation and treatment.

## 2014-10-20 NOTE — Patient Instructions (Signed)
Good to see you Ice is your friend Try to decrease thyroid medicine to 1/2 tab daily for 2 weeks and if still feeling good then discontinue.  Continue the vitamins.  Continue the exercises Thigh compression sleeve can help. Dicks or omega sports.  New exercises 3 times a week.  See me again in 6 weeks.   Popliteus Tendinitis with Rehab Tendonitis is a condition that is characterized by inflammation of a tendon. A tendon is the soft tissue that connects muscles to the skeletal system allowing for body movements. Popliteus tendonitis affects the popliteus tendon, which connects the popliteus muscle to the thigh bone (femur) near the knee. The popliteus muscle helps bend and rotate the knee. Popliteus tendonitis is often caused by a tendon tear (strain). Strains are classified into three categories. Grade 1 strains cause pain, but the tendon is not lengthened. Grade 2 strains include a lengthened ligament due to the ligament being stretched or partially ruptured. With grade 2 strains there is still function, although the function may be diminished. Grade 3 strains are characterized by a complete tear of the tendon or muscle, and function is usually impaired. SYMPTOMS   Pain in the knee, specifically the outer (lateral) and back (posterior) portions.  Pain that worsens with use of the popliteus muscle (standing on a slightly bent knee or rotating the knee).  A crackling sound (crepitation) when the tendon is moved or touched (uncommon, except when tested just after exercising). CAUSES Popliteus tendonitis occurs when damage to the popliteus tendon elicits an inflammatory (healing) response. Popliteus tendonitis is often an overuse injury. RISK INCREASES WITH:  Activities that require extensive running or walking downhill.  Poor strength and flexibility.  Failure to warm-up properly before activity.  Flat feet. PREVENTION  Warm up and stretch properly before activity.  Allow for adequate  recovery between workouts.  Maintain physical fitness:  Strength, flexibility, and endurance.  Cardiovascular fitness.  Learn and implement proper training regimens and sports technique.  Arch supports (orthotics) for individuals with flat feet. PROGNOSIS  If treated properly, then the symptoms of popliteus tendonitis usually resolve within 6 weeks.  RELATED COMPLICATIONS   Prolonged healing time, if improperly treated or re-injured.  Recurrent symptoms that result in a chronic problem. TREATMENT  Treatment initially involves the use of ice and medication to help reduce pain and inflammation. The use of strengthening and stretching exercises may help reduce pain with activity. These exercises may be performed at home or with referral to a therapist. Many individuals find that the use of a compression bandage or a knee sleeve helps reduce symptoms. If you have flat feet, then your caregiver may recommend arch supports. It is important to learn/modify techniques for running uphill/downhill that do not aggravate your symptoms. If symptoms persist for greater than 6 months despite conservative (non-surgical) treatment, then surgery may be recommended to remove the tendon sheath (lining).  MEDICATION   If pain medication is necessary, then nonsteroidal anti-inflammatory medications, such as aspirin and ibuprofen, or other minor pain relievers, such as acetaminophen, are often recommended.  Do not take pain medication within 7 days before surgery.  Prescription pain relievers may be given if deemed necessary by your caregiver. Use only as directed and only as much as you need. HEAT AND COLD  Cold treatment (icing) relieves pain and reduces inflammation. Cold treatment should be applied for 10 to 15 minutes every 2 to 3 hours for inflammation and pain and immediately after any activity that aggravates your symptoms.  Use ice packs or massage the area with a piece of ice (ice massage).  Heat  treatment may be used prior to performing the stretching and strengthening activities prescribed by your caregiver, physical therapist, or athletic trainer. Use a heat pack or soak the injury in warm water. SEEK MEDICAL CARE IF:  Treatment seems to offer no benefit, or the condition worsens.  Any medications produce adverse side effects. EXERCISES RANGE OF MOTION (ROM) AND STRETCHING EXERCISES - Popliteus Tendinitis These exercises may help you when beginning to rehabilitate your injury. Your symptoms may resolve with or without further involvement from your physician, physical therapist or athletic trainer. While completing these exercises, remember:   Restoring tissue flexibility helps normal motion to return to the joints. This allows healthier, less painful movement and activity.  An effective stretch should be held for at least 30 seconds.  A stretch should never be painful. You should only feel a gentle lengthening or release in the stretched tissue. STRETCH - Gastroc, Standing   Place hands on wall.  Extend right / left leg, keeping the front knee somewhat bent.  Slightly point your toes inward on your back foot.  Keeping your right / left heel on the floor and your knee straight, shift your weight toward the wall, not allowing your back to arch.  You should feel a gentle stretch in the right / left calf. Hold this position for __________ seconds. Repeat __________ times. Complete this stretch __________ times per day. STRETCH - Soleus, Standing   Place hands on wall.  Extend right / left leg, keeping the other knee somewhat bent.  Slightly point your toes inward on your back foot.  Keep your right / left heel on the floor, bend your back knee, and slightly shift your weight over the back leg so that you feel a gentle stretch deep in your back calf.  Hold this position for __________ seconds. Repeat __________ times. Complete this stretch __________ times per  day. STRETCH - Gastrocsoleus, Standing  Note: This exercise can place a lot of stress on your foot and ankle. Please complete this exercise only if specifically instructed by your caregiver.   Place the ball of your right / left foot on a step, keeping your other foot firmly on the same step.  Hold on to the wall or a rail for balance.  Slowly lift your other foot, allowing your body weight to press your heel down over the edge of the step.  You should feel a stretch in your right / left calf.  Hold this position for __________ seconds.  Repeat this exercise with a slight bend in your right / left knee. Repeat __________ times. Complete this stretch __________ times per day.  STRETCH - Hamstrings, Standing  Stand or sit and extend your right / left leg, placing your foot on a chair or foot stool  Keeping a slight arch in your low back and your hips straight forward.  Lead with your chest and lean forward at the waist until you feel a gentle stretch in the back of your right / left knee or thigh. (When done correctly, this exercise requires leaning only a small distance.)  Hold this position for __________ seconds. Repeat __________ times. Complete this stretch __________ times per day. STRETCH - Hamstrings, Supine   Lie on your back. Loop a belt or towel over the ball of your right / left foot.  Straighten your right / left knee and slowly pull on the belt  to raise your leg. Do not allow the right / left knee to bend. Keep your opposite leg flat on the floor.  Raise the leg until you feel a gentle stretch behind your right / left knee or thigh. Hold this position for __________ seconds. Repeat __________ times. Complete this stretch __________ times per day.  STRETCH - Hamstrings, Doorway  Lie on your back with your right / left leg extended and resting on the wall and the opposite leg flat on the ground through the door. Initially, position your bottom farther away from the wall  than the illustration shows.  Keep your right / left knee straight. If you feel a stretch behind your knee or thigh, hold this position for __________ seconds.  If you do not feel a stretch, scoot your bottom closer to the door, and hold __________ seconds. Repeat __________ times. Complete this stretch __________ times per day.  STRETCH - Quadriceps, Prone   Lie on your stomach on a firm surface, such as a bed or padded floor.  Bend your right / left knee and grasp your ankle. If you are unable to reach, your ankle or pant leg, use a belt around your foot to lengthen your reach.  Gently pull your heel toward your buttocks. Your knee should not slide out to the side. You should feel a stretch in the front of your thigh and/or knee.  Hold this position for __________ seconds. Repeat __________ times. Complete this stretch __________ times per day.  STRENGTHENING EXERCISES - Popliteus Tendinitis These exercises may help you when beginning to rehabilitate your injury. They may resolve your symptoms with or without further involvement from your physician, physical therapist or athletic trainer. While completing these exercises, remember:   Muscles can gain both the endurance and the strength needed for everyday activities through controlled exercises.  Complete these exercises as instructed by your physician, physical therapist or athletic trainer. Progress the resistance and repetitions only as guided. STRENGTH - Hamstring, Isometrics   Lie on your back on a firm surface.  Bend your right / left knee approximately __________ degrees.  Dig your heel into the surface as if you are trying to pull it toward your buttocks. Tighten the muscles in the back of your thighs to "dig" as hard as you can without increasing any pain.  Hold this position for __________ seconds.  Release the tension gradually and allow your muscle to completely relax for __________ seconds in between each  exercise. Repeat __________ times. Complete this exercise __________ times per day.  STRENGTH - Hamstring, Curls   Lay on your stomach with your legs extended. (If you lay on a bed, your feet may hang over the edge.)  Tighten the muscles in the back of your thigh to bend your right / left knee up to 90 degrees. Keep your hips flat on the bed/floor.  Hold this position for __________ seconds.  Slowly lower your leg back to the starting position. Repeat __________ times. Complete this exercise __________ times per day.  OPTIONAL ANKLE WEIGHTS: Begin with ____________________, but DO NOT exceed ____________________. Increase in1 lb/0.5 kg increments. Document Released: 09/25/2005 Document Revised: 12/18/2011 Document Reviewed: 01/07/2009 [facility] Patient Information 2015 [place], [place]. This information is not intended to replace advice given to you by your health care provider. Make sure you discuss any questions you have with your health care provider.

## 2014-11-12 ENCOUNTER — Ambulatory Visit (INDEPENDENT_AMBULATORY_CARE_PROVIDER_SITE_OTHER): Payer: BLUE CROSS/BLUE SHIELD | Admitting: Obstetrics & Gynecology

## 2014-11-12 ENCOUNTER — Encounter: Payer: Self-pay | Admitting: Obstetrics & Gynecology

## 2014-11-12 VITALS — BP 110/70 | HR 80 | Resp 16 | Ht 63.0 in | Wt 156.0 lb

## 2014-11-12 DIAGNOSIS — Z01419 Encounter for gynecological examination (general) (routine) without abnormal findings: Secondary | ICD-10-CM

## 2014-11-12 DIAGNOSIS — Z0000 Encounter for general adult medical examination without abnormal findings: Secondary | ICD-10-CM

## 2014-11-12 LAB — COMPREHENSIVE METABOLIC PANEL
ALBUMIN: 4.2 g/dL (ref 3.5–5.2)
ALT: 23 U/L (ref 0–35)
AST: 24 U/L (ref 0–37)
Alkaline Phosphatase: 74 U/L (ref 39–117)
BILIRUBIN TOTAL: 0.4 mg/dL (ref 0.2–1.2)
BUN: 19 mg/dL (ref 6–23)
CO2: 27 mEq/L (ref 19–32)
Calcium: 9.6 mg/dL (ref 8.4–10.5)
Chloride: 107 mEq/L (ref 96–112)
Creat: 0.82 mg/dL (ref 0.50–1.10)
Glucose, Bld: 77 mg/dL (ref 70–99)
Potassium: 4.3 mEq/L (ref 3.5–5.3)
Sodium: 139 mEq/L (ref 135–145)
Total Protein: 6.1 g/dL (ref 6.0–8.3)

## 2014-11-12 LAB — LIPID PANEL
CHOL/HDL RATIO: 2.8 ratio
CHOLESTEROL: 207 mg/dL — AB (ref 0–200)
HDL: 74 mg/dL (ref >39)
LDL Cholesterol: 114 mg/dL — ABNORMAL HIGH (ref 0–99)
Triglycerides: 93 mg/dL (ref <150)
VLDL: 19 mg/dL (ref 0–40)

## 2014-11-12 LAB — POCT URINALYSIS DIPSTICK
LEUKOCYTES UA: NEGATIVE
Urobilinogen, UA: NEGATIVE
pH, UA: 5

## 2014-11-12 NOTE — Progress Notes (Signed)
59 y.o. G0P0 MarriedCaucasianF here for annual exam.  Doing well.  Reports had worse depression last fall and so she didn't really do anything for her personal health care.  Pt feels, in the end, this was good for her due to medication changes.  Reports she is in a much better place now.  Reports only side effect that she is not happy with is increased appetite.  Did have a lot of blood work last year.    Got got married last year.  This was a high point to the year.    Psychiatrist:  Yehuda Budd, MD, and also seeing NP as well.    Patient's last menstrual period was 12/08/2011.          Sexually active: Yes.    The current method of family planning is none.    Exercising: Yes.    ellipitcal, walk 3-4x/wk Smoker:  no  Health Maintenance: Pap: 10/23/13 ASCUS NEG HR HPV negative History of abnormal Pap:  Yes, hx of LGSIL and ASCUS MMG:  06/26/13 Bi-Rads 1: Negative Colonoscopy:  2009- Normal f/u 10 years. BMD:   8/10 -0.3/-0.6 TDaP:  2012 Screening Labs: yes, Hb today: 12.4, Urine today: Negative   reports that she has never smoked. She has never used smokeless tobacco. She reports that she does not drink alcohol or use illicit drugs.  Past Medical History  Diagnosis Date  . GERD (gastroesophageal reflux disease)   . Depression   . Trigeminal neuralgia   . Headache(784.0)   . Anxiety   . Arthritis     knees and hands    Past Surgical History  Procedure Laterality Date  . Septoplasty  2007  . Sinus exploration  2007  . Knee arthroscopy  1973    rt  . Tonsillectomy    . Ankle arthroplasty  2008    left  . Carpal tunnel release  2009    rt  . Pilonidal cyst / sinus excision  1977  . Joint replacement  2009    rt total knee  . Knee arthroplasty  2010    revision rt total knee  . Hand arthroplasty  1989    lt  . Ear cyst excision  09/18/2011    Procedure: CYST REMOVAL;  Surgeon: Beckie Salts, MD;  Location: [facility];  Service: ENT;  Laterality: Right;   right facial cyst  . Cortisone injection      total of 12 injections in muscle around neck, right shoulder, and spine  . Epidural block injection      in back and knee    Current Outpatient Prescriptions  Medication Sig Dispense Refill  . buPROPion (WELLBUTRIN XL) 300 MG 24 hr tablet Take 300 mg by mouth daily.    Marland Kitchen CALCIUM PO Take by mouth. Occasionally calcium and magnesium    . Cholecalciferol (VITAMIN D-3 PO) Take by mouth.    . Cyanocobalamin (B-12 PO) Take by mouth.    . escitalopram (LEXAPRO) 20 MG tablet Take 20 mg by mouth daily.      Marland Kitchen HYDROcodone-acetaminophen (NORCO) 10-325 MG per tablet Take 1 tablet by mouth daily. Patient takes 4 times daily.    . mirtazapine (REMERON) 45 MG tablet Take 45 mg by mouth at bedtime.    . Multiple Vitamins-Minerals (MULTIVITAMIN PO) Take by mouth daily. Occasionally    . Omega-3 Fatty Acids (FISH OIL PO) Take by mouth.    Earney Navy Bicarbonate (ZEGERID) 20-1100 MG CAPS capsule as needed.     Marland Kitchen  promethazine (PHENERGAN) 25 MG tablet as needed.     . SUMAtriptan (IMITREX) 25 MG tablet Take 25 mg by mouth every 2 (two) hours as needed for migraine.    . VOLTAREN 1 % GEL     . zolpidem (AMBIEN) 10 MG tablet Take 10 mg by mouth at bedtime as needed.       No current facility-administered medications for this visit.    Family History  Problem Relation Age of Onset  . COPD Mother     Deceased, 73  . Heart disease Father     Deceased, 30  . Diabetes Brother   . Stroke Brother     ROS:  Pertinent items are noted in HPI.  Otherwise, a comprehensive ROS was negative.  Exam:   BP 110/70 mmHg  Pulse 80  Resp 16  Ht 5\' 3"  (1.6 m)  Wt 156 lb (70.761 kg)  BMI 27.64 kg/m2  LMP 12/08/2011  Weight change: +8#  Height: 5\' 3"  (160 cm)  Ht Readings from Last 3 Encounters:  11/12/14 5\' 3"  (1.6 m)  10/20/14 5\' 3"  (1.6 m)  07/24/14 5\' 3"  (1.6 m)    General appearance: alert, cooperative and appears stated age Head: Normocephalic,  without obvious abnormality, atraumatic Neck: no adenopathy, supple, symmetrical, trachea midline and thyroid normal to inspection and palpation Lungs: clear to auscultation bilaterally Breasts: normal appearance, no masses or tenderness Heart: regular rate and rhythm Abdomen: soft, non-tender; bowel sounds normal; no masses,  no organomegaly Extremities: extremities normal, atraumatic, no cyanosis or edema Skin: Skin color, texture, turgor normal. No rashes or lesions Lymph nodes: Cervical, supraclavicular, and axillary nodes normal. No abnormal inguinal nodes palpated Neurologic: Grossly normal   Pelvic: External genitalia:  no lesions              Urethra:  normal appearing urethra with no masses, tenderness or lesions              Bartholins and Skenes: normal                 Vagina: normal appearing vagina with normal color and discharge, no lesions              Cervix: no lesions              Pap taken: No. Bimanual Exam:  Uterus:  normal size, contour, position, consistency, mobility, non-tender              Adnexa: normal adnexa and no mass, fullness, tenderness               Rectovaginal: Confirms               Anus:  normal sphincter tone, no lesions  Chaperone was present for exam.  A:  Well Woman with normal exam PMP, no HRT H/O abnormal pap smears with neg pap and NL HR HPV 5/14 Long hx of depression due to pain issues.  P: Mammogram yearly No pap today.  Will repeat next year. CMP, Lipids, and Vit.   return annually or prn

## 2014-11-13 LAB — HEMOGLOBIN, FINGERSTICK: HEMOGLOBIN, FINGERSTICK: 12.4 g/dL (ref 12.0–16.0)

## 2014-11-13 LAB — VITAMIN D 25 HYDROXY (VIT D DEFICIENCY, FRACTURES): Vit D, 25-Hydroxy: 40 ng/mL (ref 30–100)

## 2014-12-30 ENCOUNTER — Ambulatory Visit
Admission: RE | Admit: 2014-12-30 | Discharge: 2014-12-30 | Disposition: A | Discharge status: Home / Self Care | Payer: BLUE CROSS/BLUE SHIELD | Source: Ambulatory Visit | Attending: Family Medicine | Admitting: Family Medicine

## 2014-12-30 ENCOUNTER — Other Ambulatory Visit: Payer: Self-pay | Admitting: Family Medicine

## 2014-12-30 DIAGNOSIS — J690 Pneumonitis due to inhalation of food and vomit: Secondary | ICD-10-CM

## 2015-01-04 ENCOUNTER — Encounter (HOSPITAL_COMMUNITY): Payer: Self-pay | Admitting: Emergency Medicine

## 2015-01-04 ENCOUNTER — Emergency Department (HOSPITAL_COMMUNITY)
Admission: EM | Admit: 2015-01-04 | Discharge: 2015-01-04 | Disposition: A | Discharge status: Home / Self Care | Payer: BLUE CROSS/BLUE SHIELD | Attending: Emergency Medicine | Admitting: Emergency Medicine

## 2015-01-04 ENCOUNTER — Emergency Department (HOSPITAL_COMMUNITY): Payer: BLUE CROSS/BLUE SHIELD

## 2015-01-04 DIAGNOSIS — Z79899 Other long term (current) drug therapy: Secondary | ICD-10-CM | POA: Diagnosis not present

## 2015-01-04 DIAGNOSIS — Z792 Long term (current) use of antibiotics: Secondary | ICD-10-CM | POA: Insufficient documentation

## 2015-01-04 DIAGNOSIS — M159 Polyosteoarthritis, unspecified: Secondary | ICD-10-CM | POA: Insufficient documentation

## 2015-01-04 DIAGNOSIS — K219 Gastro-esophageal reflux disease without esophagitis: Secondary | ICD-10-CM | POA: Insufficient documentation

## 2015-01-04 DIAGNOSIS — F419 Anxiety disorder, unspecified: Secondary | ICD-10-CM | POA: Insufficient documentation

## 2015-01-04 DIAGNOSIS — F329 Major depressive disorder, single episode, unspecified: Secondary | ICD-10-CM | POA: Insufficient documentation

## 2015-01-04 DIAGNOSIS — Z8669 Personal history of other diseases of the nervous system and sense organs: Secondary | ICD-10-CM | POA: Insufficient documentation

## 2015-01-04 DIAGNOSIS — J189 Pneumonia, unspecified organism: Secondary | ICD-10-CM

## 2015-01-04 DIAGNOSIS — J159 Unspecified bacterial pneumonia: Secondary | ICD-10-CM | POA: Insufficient documentation

## 2015-01-04 LAB — BASIC METABOLIC PANEL
Anion gap: 12 (ref 5–15)
BUN: 17 mg/dL (ref 6–23)
CO2: 23 mmol/L (ref 19–32)
CREATININE: 0.75 mg/dL (ref 0.50–1.10)
Calcium: 9.5 mg/dL (ref 8.4–10.5)
Chloride: 105 mmol/L (ref 96–112)
GFR calc non Af Amer: >90 mL/min (ref >90)
Glucose, Bld: 118 mg/dL — ABNORMAL HIGH (ref 70–99)
Potassium: 3.6 mmol/L (ref 3.5–5.1)
Sodium: 140 mmol/L (ref 135–145)

## 2015-01-04 LAB — CBC
HEMATOCRIT: 37.9 % (ref 36.0–46.0)
Hemoglobin: 12.5 g/dL (ref 12.0–15.0)
MCH: 30.3 pg (ref 26.0–34.0)
MCHC: 33 g/dL (ref 30.0–36.0)
MCV: 91.8 fL (ref 78.0–100.0)
Platelets: 313 10*3/uL (ref 150–400)
RBC: 4.13 MIL/uL (ref 3.87–5.11)
RDW: 12.5 % (ref 11.5–15.5)
WBC: 6.9 10*3/uL (ref 4.0–10.5)

## 2015-01-04 MED ORDER — CYCLOBENZAPRINE HCL 10 MG PO TABS: 10.0000 mg | ORAL_TABLET | Freq: Two times a day (BID) | ORAL | Status: DC | PRN

## 2015-01-04 MED ORDER — OXYCODONE HCL 5 MG PO TABS: 5.0000 mg | ORAL_TABLET | ORAL | Status: DC | PRN

## 2015-01-04 MED ORDER — OXYCODONE HCL 5 MG PO TABS
5.0000 mg | ORAL_TABLET | Freq: Once | ORAL | Status: AC
  Administered 2015-01-04: 5 mg via ORAL
  Filled 2015-01-04: qty 1

## 2015-01-04 MED ORDER — ALBUTEROL SULFATE HFA 108 (90 BASE) MCG/ACT IN AERS
2.0000 | INHALATION_SPRAY | RESPIRATORY_TRACT | Status: DC | PRN
  Administered 2015-01-04: 2 via RESPIRATORY_TRACT
  Filled 2015-01-04: qty 6.7

## 2015-01-04 NOTE — ED Notes (Signed)
Pt being sent over from pts pcp, Dr Justin Mend office, pt dx with confirmed flu, then confirmed PNA, pt is on day 7 of levaquin and not responding to abx. Pt being sent over for re-eval. Pt stablie.

## 2015-01-04 NOTE — ED Provider Notes (Signed)
CSN: [id number]     ,rrival date & time 01/04/15  1709 History   First MD Initiated Contact with Patient 01/04/15 2038     Chief Complaint  Patient presents with  . Pneumonia     (Consider location/radiation/quality/duration/timing/severity/associated sxs/prior Treatment) HPI    PCP: WEBB, CAROL D, MD Blood pressure 129/75, pulse 76, temperature 98.7 F (37.1 C), temperature source Oral, resp. rate 16, last menstrual period 12/08/2011, SpO2 96 %.  Lucero I Bermingham is a 59 y.o.female with a significant PMH of acid reflux, anxiety, trigeminal neuralgia, headaches, pneumonia presents to the ER with complaints of concern for worsening pneumonia. She reports being started on Levaquin 1 week ago, she was having fever, shortness of breath, left upper back pain, cough and fatigue. She saw her PCP and was started on Levaquin. She called her PCP and was written for a second round of Levaquin. She is not longer having fevers, fatigue or SOB. She is having worsening upper left back pain where the pneumonia is. She is concerned that it is getting worse. Pt reports having the flu multiple weeks ago but denies having a positive flu swab that she is aware of. Negative Review of Symptoms: fevers, nausea, vomiting, diarrhea, weakness, CP, SOB, DOE, lower extremity swelling, headaches, neck pain.   Past Medical History  Diagnosis Date  . GERD (gastroesophageal reflux disease)   . Depression   . Trigeminal neuralgia   . Headache(784.0)   . Anxiety   . Arthritis     knees and hands   Past Surgical History  Procedure Laterality Date  . Septoplasty  2007  . Sinus exploration  2007  . Knee arthroscopy  1973    rt  . Tonsillectomy    . Ankle arthroplasty  2008    left  . Carpal tunnel release  2009    rt  . Pilonidal cyst / sinus excision  1977  . Joint replacement  2009    rt total knee  . Knee arthroplasty  2010    revision rt total knee  . Hand arthroplasty  1989    lt  . Ear cyst  excision  09/18/2011    Procedure: CYST REMOVAL;  Surgeon: Beckie Salts, MD;  Location: [facility];  Service: ENT;  Laterality: Right;  right facial cyst  . Cortisone injection      total of 12 injections in muscle around neck, right shoulder, and spine  . Epidural block injection      in back and knee   Family History  Problem Relation Age of Onset  . COPD Mother     Deceased, 9  . Heart disease Father     Deceased, 27  . Diabetes Brother   . Stroke Brother    History  Substance Use Topics  . Smoking status: Never Smoker   . Smokeless tobacco: Never Used  . Alcohol Use: No   OB History    Gravida Para Term Preterm AB TAB SAB Ectopic Multiple Living   0              Review of Systems  10 Systems reviewed and are negative for acute change except as noted in the HPI.    Allergies  Asa arthritis strength-antacid; Carbamazepine; Celebrex; Cephalexin; Cymbalta; Darvocet; Dexilant; Diazepam; Dilantin; Klonopin; Lamictal; Lyrica; Neurontin; Nsaids; Nubain; Nubain; Oxcarbazepine; Prednisone; Pregabalin; Thorazine; and Topamax  Home Medications   Prior to Admission medications   Medication Sig Start Date End Date Taking? Authorizing Provider  buPROPion (WELLBUTRIN XL) 300 MG 24 hr tablet Take 300 mg by mouth daily.   Yes Historical Provider, MD  CALCIUM PO Take 1 capsule by mouth daily.    Yes Historical Provider, MD  Cyanocobalamin (B-12 PO) Take 1 tablet by mouth daily.    Yes Historical Provider, MD  escitalopram (LEXAPRO) 20 MG tablet Take 20 mg by mouth daily.     Yes Historical Provider, MD  HYDROcodone-acetaminophen (NORCO) 10-325 MG per tablet Take 1 tablet by mouth 5 (five) times daily.    Yes Historical Provider, MD  levofloxacin (LEVAQUIN) 500 MG tablet Take 500 mg by mouth daily.   Yes Historical Provider, MD  mirtazapine (REMERON) 45 MG tablet Take 45 mg by mouth at bedtime.   Yes Historical Provider, MD  Multiple Vitamins-Minerals (MULTIVITAMIN PO)  Take 1 tablet by mouth daily. Occasionally   Yes Historical Provider, MD  Omega-3 Fatty Acids (FISH OIL PO) Take 1 capsule by mouth daily.    Yes Historical Provider, MD  promethazine (PHENERGAN) 25 MG tablet Take 25 mg by mouth every 6 (six) hours as needed for nausea (nausea).  09/24/13  Yes Historical Provider, MD  zolpidem (AMBIEN) 10 MG tablet Take 5 mg by mouth at bedtime.    Yes Historical Provider, MD  cyclobenzaprine (FLEXERIL) 10 MG tablet Take 1 tablet (10 mg total) by mouth 2 (two) times daily as needed for muscle spasms. 01/04/15   Delos Haring, PA-C  oxyCODONE (OXY IR/ROXICODONE) 5 MG immediate release tablet Take 1 tablet (5 mg total) by mouth every 4 (four) hours as needed for severe pain. 01/04/15   Delos Haring, PA-C  SUMAtriptan (IMITREX) 25 MG tablet Take 25 mg by mouth every 2 (two) hours as needed for migraine.    Historical Provider, MD  VOLTAREN 1 % GEL Apply 2 g topically 4 (four) times daily as needed (pain).  02/04/14   Historical Provider, MD   BP 129/75 mmHg  Pulse 76  Temp(Src) 98.7 F (37.1 C) (Oral)  Resp 16  SpO2 96%  LMP 12/08/2011 Physical Exam  Constitutional: She appears well-developed and well-nourished. No distress.  HENT:  Head: Normocephalic and atraumatic.  Right Ear: External ear normal.  Left Ear: External ear normal.  Nose: Nose normal.  Eyes: Pupils are equal, round, and reactive to light.  Neck: Normal range of motion. Neck supple.  Cardiovascular: Normal rate and regular rhythm.   Pulmonary/Chest: Effort normal. She has no decreased breath sounds. She has no wheezes. She has rhonchi (mild rhonchi at left mid/upper lungs). She has no rales.  Abdominal: Soft.  Musculoskeletal:  No lower extremity swelling  Neurological: She is alert.  Skin: Skin is warm and dry. She is not diaphoretic.  Nursing note and vitals reviewed.   ED Course  Procedures (including critical care time) Labs Review Labs Reviewed  BASIC METABOLIC PANEL -  Abnormal; Notable for the following:    Glucose, Bld 118 (*)    All other components within normal limits  CBC    Imaging Review Dg Chest 2 View  01/04/2015   CLINICAL DATA:  Pt being sent over from pts pcp, Dr Justin Mend office, pt dx with confirmed flu, then confirmed PNA, pt is on day 7 of levaquin and not responding to abx. Pt being sent over for re-eval. Nonsmoker  EXAM: CHEST  2 VIEW  COMPARISON:  12/30/2014.  FINDINGS: Left lower lobe pneumonia has improved. There is still mild residual opacity which could reflect residual pneumonia or postinflammatory atelectasis.  No new areas of lung consolidation. No pleural effusion or pneumothorax.  Normal heart, mediastinum hila.  Bony thorax is unremarkable.  IMPRESSION: Improved left lower lobe pneumonia.  No new abnormalities.   Electronically Signed   By: Lajean Manes M.D.   On: 01/04/2015 19:44     EKG Interpretation None      MDM   Final diagnoses:  CAP (community acquired pneumonia)     The patients lab work is reassuring. Her CBC shows no abnormalities including a non elevated WBC. Her BMP is also WNL without any abnormalities. Her Chest xray shows IMPROVED left lower lobe pneumonia and no CHF, pneumothorax, worsening pneumonia or broken ribs.  The patient is over-all feeling better. She stays on chronic Hydrocodone 10 mg for chronic pain and this is not helping. She is not having tachycardia, SOB, CP, lower extremity swelling, fatigue, fever, diaphoresis DOE or any other symptom aside from pain to the LEFT back to suggest acute pathology. SHe has been given reassuring and strict return to ED precautions.  Rx; oxycodone 5mg  and Flexeril for break through pain. Referral back to PCP. Continue new rx for Levaquin.  59 y.o.Becca I Esco's evaluation in the Emergency Department is complete. It has been determined that no acute conditions requiring further emergency intervention are present at this time. The patient/guardian have been  advised of the diagnosis and plan. We have discussed signs and symptoms that warrant return to the ED, such as changes or worsening in symptoms.  Vital signs are stable at discharge. Filed Vitals:   01/04/15 1716  BP: 129/75  Pulse: 76  Temp: 98.7 F (37.1 C)  Resp: 16    Patient/guardian has voiced understanding and agreed to follow-up with the PCP or specialist.   Delos Haring, PA-C 01/04/15 2124  Serita Grit, MD 01/05/15 1513

## 2016-01-27 ENCOUNTER — Ambulatory Visit (INDEPENDENT_AMBULATORY_CARE_PROVIDER_SITE_OTHER): Payer: 59 | Admitting: Obstetrics & Gynecology

## 2016-01-27 ENCOUNTER — Encounter: Payer: Self-pay | Admitting: Obstetrics & Gynecology

## 2016-01-27 VITALS — BP 120/66 | HR 74 | Resp 16 | Ht 63.0 in | Wt 160.0 lb

## 2016-01-27 DIAGNOSIS — Z01419 Encounter for gynecological examination (general) (routine) without abnormal findings: Secondary | ICD-10-CM

## 2016-01-27 DIAGNOSIS — Z0000 Encounter for general adult medical examination without abnormal findings: Secondary | ICD-10-CM | POA: Diagnosis not present

## 2016-01-27 DIAGNOSIS — Z205 Contact with and (suspected) exposure to viral hepatitis: Secondary | ICD-10-CM | POA: Diagnosis not present

## 2016-01-27 DIAGNOSIS — Z124 Encounter for screening for malignant neoplasm of cervix: Secondary | ICD-10-CM | POA: Diagnosis not present

## 2016-01-27 LAB — HEMOGLOBIN A1C
Hgb A1c MFr Bld: 5.7 % — ABNORMAL HIGH (ref <5.7)
Mean Plasma Glucose: 117 mg/dL

## 2016-01-27 LAB — LIPID PANEL
Cholesterol: 209 mg/dL — ABNORMAL HIGH (ref 125–200)
HDL: 84 mg/dL (ref >=46)
LDL Cholesterol: 107 mg/dL (ref <130)
TRIGLYCERIDES: 89 mg/dL (ref <150)
Total CHOL/HDL Ratio: 2.5 Ratio (ref <=5.0)
VLDL: 18 mg/dL (ref <30)

## 2016-01-27 LAB — COMPREHENSIVE METABOLIC PANEL
ALK PHOS: 67 U/L (ref 33–130)
ALT: 16 U/L (ref 6–29)
AST: 21 U/L (ref 10–35)
Albumin: 4.1 g/dL (ref 3.6–5.1)
BILIRUBIN TOTAL: 0.4 mg/dL (ref 0.2–1.2)
BUN: 17 mg/dL (ref 7–25)
CO2: 25 mmol/L (ref 20–31)
CREATININE: 0.83 mg/dL (ref 0.50–1.05)
Calcium: 9.2 mg/dL (ref 8.6–10.4)
Chloride: 104 mmol/L (ref 98–110)
GLUCOSE: 86 mg/dL (ref 65–99)
POTASSIUM: 4.3 mmol/L (ref 3.5–5.3)
Sodium: 138 mmol/L (ref 135–146)
Total Protein: 6.2 g/dL (ref 6.1–8.1)

## 2016-01-27 LAB — CBC
HCT: 37.8 % (ref 35.0–45.0)
Hemoglobin: 12.5 g/dL (ref 11.7–15.5)
MCH: 30.2 pg (ref 27.0–33.0)
MCHC: 33.1 g/dL (ref 32.0–36.0)
MCV: 91.3 fL (ref 80.0–100.0)
MPV: 10.2 fL (ref 7.5–12.5)
PLATELETS: 235 10*3/uL (ref 140–400)
RBC: 4.14 MIL/uL (ref 3.80–5.10)
RDW: 14.1 % (ref 11.0–15.0)
WBC: 5.6 10*3/uL (ref 3.8–10.8)

## 2016-01-27 LAB — TSH: TSH: 1.9 m[IU]/L

## 2016-01-27 NOTE — Progress Notes (Signed)
60 y.o. G0P0 MarriedCaucasianF here for annual exam.  Doing well.  Pt was hospitalized due to pneumonia last March.    PCP:  Maurice Small.  Just saw her recently due to needing medication refills.  Psychiatric care provider has moved.  Would like a name of a provider.    Patient's last menstrual period was 12/08/2011.          Sexually active: No.  The current method of family planning is post menopausal status.    Exercising: No.  The patient does not participate in regular exercise at present. Smoker:  no  Health Maintenance: Pap:  10/23/13 ASCUS. 02/2012 Neg. HR HPV:neg History of abnormal Pap:  yes MMG:  06/26/2013 BIRADS1:neg Colonoscopy:  06/25/2008 Normal - repeat 10 years  BMD:   05/2009  TDaP:  2012 Screening Labs: Here, Urine today: pending   reports that she has never smoked. She has never used smokeless tobacco. She reports that she does not drink alcohol or use illicit drugs.  Past Medical History  Diagnosis Date  . GERD (gastroesophageal reflux disease)   . Depression   . Trigeminal neuralgia   . Headache(784.0)   . Anxiety   . Arthritis     knees and hands    Past Surgical History  Procedure Laterality Date  . Septoplasty  2007  . Sinus exploration  2007  . Knee arthroscopy  1973    rt  . Tonsillectomy    . Ankle arthroplasty  2008    left  . Carpal tunnel release  2009    rt  . Pilonidal cyst / sinus excision  1977  . Joint replacement  2009    rt total knee  . Knee arthroplasty  2010    revision rt total knee  . Hand arthroplasty  1989    lt  . Ear cyst excision  09/18/2011    Procedure: CYST REMOVAL;  Surgeon: Beckie Salts, MD;  Location: [facility];  Service: ENT;  Laterality: Right;  right facial cyst  . Cortisone injection      total of 12 injections in muscle around neck, right shoulder, and spine  . Epidural block injection      in back and knee    Current Outpatient Prescriptions  Medication Sig Dispense Refill  . Biotin  5000 MCG CAPS Take by mouth daily.    Marland Kitchen buPROPion (WELLBUTRIN XL) 300 MG 24 hr tablet Take 300 mg by mouth daily.    Marland Kitchen BUTRANS 10 MCG/HR PTWK patch 1 patch every 7 (seven) days.  1  . CALCIUM PO Take 1 capsule by mouth daily.     . cholecalciferol (VITAMIN D) 1000 units tablet Take 3,000 Units by mouth daily.    . Cyanocobalamin (B-12 PO) Take 1 tablet by mouth daily.     Marland Kitchen escitalopram (LEXAPRO) 20 MG tablet Take 20 mg by mouth daily.      Marland Kitchen HYDROcodone-acetaminophen (NORCO) 10-325 MG per tablet Take 1 tablet by mouth 5 (five) times daily.     . mirtazapine (REMERON) 45 MG tablet Take 45 mg by mouth at bedtime.    . Multiple Vitamins-Minerals (MULTIVITAMIN PO) Take 1 tablet by mouth daily. Occasionally    . Omega-3 Fatty Acids (FISH OIL PO) Take 1 capsule by mouth daily.     . promethazine (PHENERGAN) 25 MG tablet Take 25 mg by mouth every 6 (six) hours as needed for nausea (nausea).     . SUMAtriptan (IMITREX) 25 MG tablet  Take 25 mg by mouth every 2 (two) hours as needed for migraine.    Marland Kitchen VYVANSE 70 MG capsule Take 1 tablet by mouth daily.  0  . zolpidem (AMBIEN) 10 MG tablet Take 5 mg by mouth at bedtime.      No current facility-administered medications for this visit.    Family History  Problem Relation Age of Onset  . COPD Mother     Deceased, 28  . Heart disease Father     Deceased, 22  . Diabetes Brother   . Stroke Brother     ROS:  Pertinent items are noted in HPI.  Otherwise, a comprehensive ROS was negative.  Exam:   BP 120/66 mmHg  Pulse 74  Resp 16  Ht 5\' 3"  (1.6 m)  Wt 160 lb (72.576 kg)  BMI 28.35 kg/m2  LMP 12/08/2011  Weight change:  +4#   Height: 5\' 3"  (160 cm)  Ht Readings from Last 3 Encounters:  01/27/16 5\' 3"  (1.6 m)  11/12/14 5\' 3"  (1.6 m)  10/20/14 5\' 3"  (1.6 m)    General appearance: alert, cooperative and appears stated age Head: Normocephalic, without obvious abnormality, atraumatic Neck: no adenopathy, supple, symmetrical, trachea midline  and thyroid normal to inspection and palpation Lungs: clear to auscultation bilaterally Breasts: normal appearance, no masses or tenderness Heart: regular rate and rhythm Abdomen: soft, non-tender; bowel sounds normal; no masses,  no organomegaly Extremities: extremities normal, atraumatic, no cyanosis or edema Skin: Skin color, texture, turgor normal. No rashes or lesions Lymph nodes: Cervical, supraclavicular, and axillary nodes normal. No abnormal inguinal nodes palpated Neurologic: Grossly normal   Pelvic: External genitalia:  no lesions              Urethra:  normal appearing urethra with no masses, tenderness or lesions              Bartholins and Skenes: normal                 Vagina: normal appearing vagina with normal color and discharge, no lesions              Cervix: no lesions              Pap taken: Yes.   Bimanual Exam:  Uterus:  normal size, contour, position, consistency, mobility, non-tender              Adnexa: normal adnexa and no mass, fullness, tenderness               Rectovaginal: Confirms               Anus:  normal sphincter tone, no lesions  Chaperone was present for exam.  A:  Well Woman with normal exam PMP, no HRT H/O abnormal pap smears with neg pap and NL HR HPV 5/14 Long hx of depression due to pain issues.  P: Mammogram yearly.  Pt clearly aware this is due.  Pap today. Will repeat next year. CMP, Lipids, CBC, HbA1C, TSH.  Vit D of 40 in 2/16. Hep C antibody return annually or prn

## 2016-01-27 NOTE — Addendum Note (Signed)
Addended by: Elroy Channel on: 01/27/2016 03:46 PM   Modules accepted: Orders, SmartSet

## 2016-01-27 NOTE — Patient Instructions (Signed)
Talmage Coin, MD, PA Specialty: Psychiatry  Practice Address: [address] [place], [place] [address]  Phone: [phone]

## 2016-01-28 LAB — HEPATITIS C ANTIBODY: HCV Ab: NEGATIVE

## 2016-02-02 LAB — IPS PAP TEST WITH REFLEX TO HPV

## 2016-02-03 ENCOUNTER — Telehealth: Payer: Self-pay

## 2016-02-03 NOTE — Telephone Encounter (Signed)
Left message to call Kaitlyn at [phone]. 

## 2016-02-03 NOTE — Telephone Encounter (Signed)
-----   Message from Brittney Dom, MD sent at 02/02/2016  5:27 PM EDT ----- Please inform the patient that her pap is ASCUS with negative hpv, the recommendation is for a f/u pap in 3 years.

## 2016-02-07 NOTE — Telephone Encounter (Signed)
Spoke with patient. Advised of results as seen below from [place]. She is agreeable and verbalizes understanding.  Routing to provider for final review. Patient agreeable to disposition. Will close encounter.

## 2016-02-15 ENCOUNTER — Encounter: Payer: Self-pay | Admitting: Internal Medicine

## 2017-04-13 ENCOUNTER — Other Ambulatory Visit: Payer: Self-pay | Admitting: Family Medicine

## 2017-04-13 ENCOUNTER — Other Ambulatory Visit: Payer: BLUE CROSS/BLUE SHIELD

## 2017-04-13 ENCOUNTER — Ambulatory Visit
Admission: RE | Admit: 2017-04-13 | Discharge: 2017-04-13 | Disposition: A | Discharge status: Home / Self Care | Payer: 59 | Source: Ambulatory Visit | Attending: Family Medicine | Admitting: Family Medicine

## 2017-04-13 DIAGNOSIS — S0990XD Unspecified injury of head, subsequent encounter: Secondary | ICD-10-CM

## 2017-04-13 DIAGNOSIS — R42 Dizziness and giddiness: Secondary | ICD-10-CM

## 2017-05-15 NOTE — Progress Notes (Deleted)
61 y.o. G0P0 Married Caucasian F here for annual exam.    Patient's last menstrual period was 12/08/2011.          Sexually active: {yes no:314532}  The current method of family planning is post menopausal status.    Exercising: {yes no:314532}  {types:19826} Smoker:  no  Health Maintenance: Pap:  01/27/16 ASCUS, HR HPV negative, 10/23/13 ASCUS, 07/02/13 negative, HR HPV negative  History of abnormal Pap:  yes MMG:  01/31/16 BIRADS 1 negative  Colonoscopy:  06/25/08 normal- repeat 10 years  BMD:   05/2009  TDaP:  2012  Pneumonia vaccine(s):  *** Zostavax:   *** Hep C testing: 01/27/16 negative  Screening Labs: ***, Hb today: ***, Urine today: ***   reports that she has never smoked. She has never used smokeless tobacco. She reports that she does not drink alcohol or use drugs.  Past Medical History:  Diagnosis Date  . Anxiety   . Arthritis    knees and hands  . Depression   . GERD (gastroesophageal reflux disease)   . Headache(784.0)   . Trigeminal neuralgia     Past Surgical History:  Procedure Laterality Date  . ANKLE ARTHROPLASTY  2008   left  . CARPAL TUNNEL RELEASE  2009   rt  . cortisone injection     total of 12 injections in muscle around neck, right shoulder, and spine  . EAR CYST EXCISION  09/18/2011   Procedure: CYST REMOVAL;  Surgeon: Beckie Salts, MD;  Location: [facility];  Service: ENT;  Laterality: Right;  right facial cyst  . EPIDURAL BLOCK INJECTION     in back and knee  . HAND ARTHROPLASTY  1989   lt  . JOINT REPLACEMENT  2009   rt total knee  . KNEE ARTHROPLASTY  2010   revision rt total knee  . KNEE ARTHROSCOPY  1973   rt  . PILONIDAL CYST / SINUS EXCISION  1977  . SEPTOPLASTY  2007  . SINUS EXPLORATION  2007  . TONSILLECTOMY      Current Outpatient Prescriptions  Medication Sig Dispense Refill  . Biotin 5000 MCG CAPS Take by mouth daily.    Marland Kitchen buPROPion (WELLBUTRIN XL) 300 MG 24 hr tablet Take 300 mg by mouth daily.    Marland Kitchen  BUTRANS 10 MCG/HR PTWK patch 1 patch every 7 (seven) days.  1  . CALCIUM PO Take 1 capsule by mouth daily.     . cholecalciferol (VITAMIN D) 1000 units tablet Take 3,000 Units by mouth daily.    . Cyanocobalamin (B-12 PO) Take 1 tablet by mouth daily.     Marland Kitchen escitalopram (LEXAPRO) 20 MG tablet Take 20 mg by mouth daily.      Marland Kitchen HYDROcodone-acetaminophen (NORCO) 10-325 MG per tablet Take 1 tablet by mouth 5 (five) times daily.     . mirtazapine (REMERON) 45 MG tablet Take 45 mg by mouth at bedtime.    . Multiple Vitamins-Minerals (MULTIVITAMIN PO) Take 1 tablet by mouth daily. Occasionally    . Omega-3 Fatty Acids (FISH OIL PO) Take 1 capsule by mouth daily.     . promethazine (PHENERGAN) 25 MG tablet Take 25 mg by mouth every 6 (six) hours as needed for nausea (nausea).     . SUMAtriptan (IMITREX) 25 MG tablet Take 25 mg by mouth every 2 (two) hours as needed for migraine.    Marland Kitchen VYVANSE 70 MG capsule Take 1 tablet by mouth daily.  0  . zolpidem (  AMBIEN) 10 MG tablet Take 5 mg by mouth at bedtime.      No current facility-administered medications for this visit.     Family History  Problem Relation Age of Onset  . COPD Mother        Deceased, 22  . Heart disease Father        Deceased, 32  . Diabetes Brother   . Stroke Brother     ROS:  Pertinent items are noted in HPI.  Otherwise, a comprehensive ROS was negative.  Exam:   LMP 12/08/2011   Weight change: @WEIGHTCHANGE @ Height:      Ht Readings from Last 3 Encounters:  01/27/16 5\' 3"  (1.6 m)  11/12/14 5\' 3"  (1.6 m)  10/20/14 5\' 3"  (1.6 m)    General appearance: alert, cooperative and appears stated age Head: Normocephalic, without obvious abnormality, atraumatic Neck: no adenopathy, supple, symmetrical, trachea midline and thyroid {EXAM; THYROID:18604} Lungs: clear to auscultation bilaterally Breasts: {Exam; breast:13139::"normal appearance, no masses or tenderness"} Heart: regular rate and rhythm Abdomen: soft, non-tender;  bowel sounds normal; no masses,  no organomegaly Extremities: extremities normal, atraumatic, no cyanosis or edema Skin: Skin color, texture, turgor normal. No rashes or lesions Lymph nodes: Cervical, supraclavicular, and axillary nodes normal. No abnormal inguinal nodes palpated Neurologic: Grossly normal   Pelvic: External genitalia:  no lesions              Urethra:  normal appearing urethra with no masses, tenderness or lesions              Bartholins and Skenes: normal                 Vagina: normal appearing vagina with normal color and discharge, no lesions              Cervix: {exam; cervix:14595}              Pap taken: {yes no:314532} Bimanual Exam:  Uterus:  {exam; uterus:12215}              Adnexa: {exam; adnexa:12223}               Rectovaginal: Confirms               Anus:  normal sphincter tone, no lesions  Chaperone was present for exam.  A:  Well Woman with normal exam  P:   {plan; gyn:5269::"mammogram","pap smear","return annually or prn"}

## 2017-05-17 ENCOUNTER — Ambulatory Visit: Payer: 59 | Admitting: Obstetrics & Gynecology

## 2017-06-15 DIAGNOSIS — H8111 Benign paroxysmal vertigo, right ear: Secondary | ICD-10-CM | POA: Insufficient documentation

## 2017-06-19 ENCOUNTER — Telehealth: Payer: Self-pay | Admitting: Obstetrics & Gynecology

## 2017-06-19 NOTE — Telephone Encounter (Signed)
06/19/17 lmtcb re: offer 06/20/17 appt with Dr. Haze Boyden

## 2017-06-20 ENCOUNTER — Encounter: Payer: Self-pay | Admitting: Obstetrics & Gynecology

## 2017-06-20 ENCOUNTER — Other Ambulatory Visit (HOSPITAL_COMMUNITY)
Admission: RE | Admit: 2017-06-20 | Discharge: 2017-06-20 | Disposition: A | Discharge status: Home / Self Care | Payer: 59 | Source: Ambulatory Visit | Attending: Obstetrics & Gynecology | Admitting: Obstetrics & Gynecology

## 2017-06-20 ENCOUNTER — Ambulatory Visit (INDEPENDENT_AMBULATORY_CARE_PROVIDER_SITE_OTHER): Payer: 59 | Admitting: Obstetrics & Gynecology

## 2017-06-20 VITALS — BP 108/60 | HR 72 | Resp 16 | Ht 62.5 in | Wt 175.0 lb

## 2017-06-20 DIAGNOSIS — Z01419 Encounter for gynecological examination (general) (routine) without abnormal findings: Secondary | ICD-10-CM | POA: Diagnosis not present

## 2017-06-20 DIAGNOSIS — R87610 Atypical squamous cells of undetermined significance on cytologic smear of cervix (ASC-US): Secondary | ICD-10-CM | POA: Diagnosis not present

## 2017-06-20 DIAGNOSIS — E2839 Other primary ovarian failure: Secondary | ICD-10-CM

## 2017-06-20 NOTE — Patient Instructions (Signed)
Consider getting the shingles vaccination (the Shingrix) when you are ready for it.

## 2017-06-20 NOTE — Progress Notes (Signed)
61 y.o. G0P0 MarriedCaucasianF here for annual exam.  Reports she fell in June down the stairs and had a head injury with a concussion.  Had vertigo with this.  Saw Dr. Constance Holster last week.  Having dizziness.    Patient's last menstrual period was 12/08/2011.          Sexually active: No.  The current method of family planning is post menopausal status.    Exercising: No.  The patient does not participate in regular exercise at present. Smoker:  no  Health Maintenance: Pap:  01/27/16 ASCUS. HR HPV:neg   10/23/13 ASCUS.   02/06/13 Neg. HR HPV:neg  History of abnormal Pap:  Yes, LGSIL, long hx of ASCUS paps. MMG:  01/31/16 BIRADS1:neg  Colonoscopy:  06/25/08 normal. F/u 10 years  BMD:   05/2009 TDaP:  2012 Pneumonia vaccine(s):  No Zostavax:   No Hep C testing: 01/27/16 neg  Screening Labs: PCP   reports that she has never smoked. She has never used smokeless tobacco. She reports that she does not drink alcohol or use drugs.  Past Medical History:  Diagnosis Date  . Anxiety   . Arthritis    knees and hands  . Depression   . GERD (gastroesophageal reflux disease)   . Headache(784.0)   . Trigeminal neuralgia     Past Surgical History:  Procedure Laterality Date  . ANKLE ARTHROPLASTY  2008   left  . CARPAL TUNNEL RELEASE  2009   rt  . cortisone injection     total of 12 injections in muscle around neck, right shoulder, and spine  . EAR CYST EXCISION  09/18/2011   Procedure: CYST REMOVAL;  Surgeon: Beckie Salts, MD;  Location: [facility];  Service: ENT;  Laterality: Right;  right facial cyst  . EPIDURAL BLOCK INJECTION     in back and knee  . HAND ARTHROPLASTY  1989   lt  . JOINT REPLACEMENT  2009   rt total knee  . KNEE ARTHROPLASTY  2010   revision rt total knee  . KNEE ARTHROSCOPY  1973   rt  . PILONIDAL CYST / SINUS EXCISION  1977  . SEPTOPLASTY  2007  . SINUS EXPLORATION  2007  . TONSILLECTOMY      Current Outpatient Prescriptions  Medication Sig  Dispense Refill  . Biotin 5000 MCG CAPS Take by mouth daily.    Marland Kitchen buPROPion (WELLBUTRIN XL) 300 MG 24 hr tablet Take 300 mg by mouth daily.    Marland Kitchen BUTRANS 10 MCG/HR PTWK patch 1 patch every 7 (seven) days.  1  . CALCIUM PO Take 1 capsule by mouth daily.     . cholecalciferol (VITAMIN D) 1000 units tablet Take 3,000 Units by mouth daily.    . Cyanocobalamin (B-12 PO) Take 1 tablet by mouth daily.     Marland Kitchen escitalopram (LEXAPRO) 20 MG tablet Take 20 mg by mouth daily.      Marland Kitchen HYDROcodone-acetaminophen (NORCO) 10-325 MG per tablet Take 1 tablet by mouth 5 (five) times daily.     . mirtazapine (REMERON) 45 MG tablet Take 45 mg by mouth at bedtime.    . Multiple Vitamins-Minerals (MULTIVITAMIN PO) Take 1 tablet by mouth daily. Occasionally    . Omega-3 Fatty Acids (FISH OIL PO) Take 1 capsule by mouth daily.     . Turmeric 500 MG TABS Take by mouth daily.    Marland Kitchen VYVANSE 70 MG capsule Take 1 tablet by mouth daily.  0  . zolpidem (  AMBIEN) 10 MG tablet Take 5 mg by mouth at bedtime.      No current facility-administered medications for this visit.     Family History  Problem Relation Age of Onset  . COPD Mother        Deceased, 34  . Heart disease Father        Deceased, 82  . Diabetes Brother   . Stroke Brother     ROS:  Pertinent items are noted in HPI.  Otherwise, a comprehensive ROS was negative.  Exam:   BP 108/60 (BP Location: Right Arm, Patient Position: Sitting, Cuff Size: Normal)   Pulse 72   Resp 16   Ht 5' 2.5" (1.588 m)   Wt 175 lb (79.4 kg)   LMP 12/08/2011   BMI 31.50 kg/m   Weight change: +15#  Height: 5' 2.5" (158.8 cm)  Ht Readings from Last 3 Encounters:  06/20/17 5' 2.5" (1.588 m)  01/27/16 5\' 3"  (1.6 m)  11/12/14 5\' 3"  (1.6 m)    General appearance: alert, cooperative and appears stated age Head: Normocephalic, without obvious abnormality, atraumatic Neck: no adenopathy, supple, symmetrical, trachea midline and thyroid normal to inspection and palpation Lungs:  clear to auscultation bilaterally Breasts: normal appearance, no masses or tenderness Heart: regular rate and rhythm Abdomen: soft, non-tender; bowel sounds normal; no masses,  no organomegaly Extremities: extremities normal, atraumatic, no cyanosis or edema Skin: Skin color, texture, turgor normal. No rashes or lesions Lymph nodes: Cervical, supraclavicular, and axillary nodes normal. No abnormal inguinal nodes palpated Neurologic: Grossly normal   Pelvic: External genitalia:  no lesions              Urethra:  normal appearing urethra with no masses, tenderness or lesions              Bartholins and Skenes: normal                 Vagina: normal appearing vagina with atrophic changes, no lesions              Cervix: no lesions              Pap taken: Yes.   Bimanual Exam:  Uterus:  normal size, contour, position, consistency, mobility, non-tender              Adnexa: normal adnexa and no mass, fullness, tenderness               Rectovaginal: Confirms               Anus:  normal sphincter tone, no lesions  Chaperone was present for exam.  A:  Well Woman with normal exam PMP, no HRT H/O abnormal pap smears H/O depression, stable Concussion from fall down stairs earlier this summer, followed currently by ENT   P:   Mammogram guidelines reviewed Plan BMD with MMG D/w pt shingles vaccination.  Declines today. pap smear with HR HPV obtained today Lab work with Dr. Justin Mend D/w pt Shingrix vaccination.  Declines this year but is interested  return annually or prn

## 2017-06-22 ENCOUNTER — Ambulatory Visit: Payer: 59 | Admitting: Obstetrics & Gynecology

## 2017-06-22 LAB — CYTOLOGY - PAP
Diagnosis: NEGATIVE
HPV: NOT DETECTED

## 2017-07-05 HISTORY — PX: TRIGGER FINGER RELEASE: SHX641

## 2017-07-10 ENCOUNTER — Other Ambulatory Visit: Payer: Self-pay | Admitting: Obstetrics & Gynecology

## 2017-07-10 DIAGNOSIS — Z1231 Encounter for screening mammogram for malignant neoplasm of breast: Secondary | ICD-10-CM

## 2017-08-06 ENCOUNTER — Ambulatory Visit
Admission: RE | Admit: 2017-08-06 | Discharge: 2017-08-06 | Disposition: A | Discharge status: Home / Self Care | Payer: 59 | Source: Ambulatory Visit | Attending: Obstetrics & Gynecology | Admitting: Obstetrics & Gynecology

## 2017-08-06 DIAGNOSIS — E2839 Other primary ovarian failure: Secondary | ICD-10-CM

## 2017-08-06 DIAGNOSIS — Z1231 Encounter for screening mammogram for malignant neoplasm of breast: Secondary | ICD-10-CM

## 2017-08-09 ENCOUNTER — Ambulatory Visit (INDEPENDENT_AMBULATORY_CARE_PROVIDER_SITE_OTHER): Payer: 59 | Admitting: Obstetrics & Gynecology

## 2017-08-09 ENCOUNTER — Encounter: Payer: Self-pay | Admitting: Obstetrics & Gynecology

## 2017-08-09 VITALS — BP 114/62 | HR 84 | Resp 14 | Ht 62.5 in | Wt 178.5 lb

## 2017-08-09 DIAGNOSIS — M810 Age-related osteoporosis without current pathological fracture: Secondary | ICD-10-CM

## 2017-08-09 NOTE — Progress Notes (Signed)
GYNECOLOGY  VISIT  CC:   Discuss BMD  HPI: 61 y.o. G0P0 Married Caucasian female here to discuss recent BMD obtained 08/06/17 with T score -2.5 in right hip and -2.0 in AP spine.  Pt did have BMD in 2010 and this was normal.  Not sure if this more recent BMD was done on the same machine.    Results reviewed with pt including diagnosis of osteoporosis.  FRAX score not calculated.    Pt is inactive and has been for the last several years due to chronic pain issues (that are under better control).  No fracture history.    PMP, no HRT.  GYNECOLOGIC HISTORY: Patient's last menstrual period was 12/08/2011. Contraception: PMP Menopausal hormone therapy: none  Patient Active Problem List   Diagnosis Date Noted  . Benign paroxysmal positional vertigo of right ear 06/15/2017  . Chronic pain syndrome 10/20/2014  . Depression 07/24/2014  . Disc disease, degenerative, cervical 07/10/2014  . Ulnar neuropathy 07/10/2014  . Right wrist tendinitis 03/31/2014  . Muscle tiredness 03/31/2014    Past Medical History:  Diagnosis Date  . Anxiety   . Arthritis    knees and hands  . Depression   . GERD (gastroesophageal reflux disease)   . Headache(784.0)   . Trigeminal neuralgia     Past Surgical History:  Procedure Laterality Date  . ANKLE ARTHROPLASTY  2008   left  . CARPAL TUNNEL RELEASE  2009   rt  . cortisone injection     total of 12 injections in muscle around neck, right shoulder, and spine  . EPIDURAL BLOCK INJECTION     in back and knee  . HAND ARTHROPLASTY  1989   lt  . JOINT REPLACEMENT  2009   rt total knee  . KNEE ARTHROPLASTY  2010   revision rt total knee  . KNEE ARTHROSCOPY  1973   rt  . PILONIDAL CYST / SINUS EXCISION  1977  . SEPTOPLASTY  2007  . SINUS EXPLORATION  2007  . TONSILLECTOMY    . TRIGGER FINGER RELEASE Left 07/05/2017    MEDS:   Current Outpatient Medications on File Prior to Visit  Medication Sig Dispense Refill  . Biotin 5000 MCG CAPS Take  by mouth daily.    Marland Kitchen buPROPion (WELLBUTRIN XL) 300 MG 24 hr tablet Take 300 mg by mouth daily.    Marland Kitchen BUTRANS 10 MCG/HR PTWK patch 1 patch every 7 (seven) days.  1  . CALCIUM PO Take 1 capsule by mouth daily.     . cholecalciferol (VITAMIN D) 1000 units tablet Take 3,000 Units by mouth daily.    . Cyanocobalamin (B-12 PO) Take 1 tablet by mouth daily.     Marland Kitchen escitalopram (LEXAPRO) 20 MG tablet Take 20 mg by mouth daily.      Marland Kitchen HYDROcodone-acetaminophen (NORCO) 10-325 MG per tablet Take 1 tablet by mouth 5 (five) times daily.     . meclizine (ANTIVERT) 25 MG tablet TAKE 1 TABLET BY MOUTH TWICE DAILY AS NEEDED FOR SEVERE VERTIGO  1  . mirtazapine (REMERON) 45 MG tablet Take 45 mg by mouth at bedtime.    . Multiple Vitamins-Minerals (MULTIVITAMIN PO) Take 1 tablet by mouth daily. Occasionally    . Omega-3 Fatty Acids (FISH OIL PO) Take 1 capsule by mouth daily.     . Turmeric 500 MG TABS Take by mouth daily.    Marland Kitchen VYVANSE 70 MG capsule Take 1 tablet by mouth daily.  0  . zolpidem (AMBIEN)  10 MG tablet Take 5 mg by mouth at bedtime.      No current facility-administered medications on file prior to visit.     ALLERGIES: Asa arthritis strength-antacid [aspirin buffered]; Carbamazepine; Celebrex [celecoxib]; Cephalexin; Chlorpromazine hcl; Cymbalta [duloxetine hcl]; Darvocet [propoxyphene n-acetaminophen]; Dexilant [dexlansoprazole]; Diazepam; Dilantin [phenytoin sodium extended]; Klonopin [clonazepam]; Lamictal [lamotrigine]; Lyrica [pregabalin]; Neurontin [gabapentin]; Nsaids; Nubain [nalbuphine hcl]; Nubain [nalbuphine hcl]; Oxcarbazepine; Phenytoin sodium extended; Prednisone; Pregabalin; Propoxyphene; Thorazine [chlorpromazine hcl]; Tolmetin; and Topamax [topiramate]  Family History  Problem Relation Age of Onset  . COPD Mother        Deceased, 53  . Heart disease Father        Deceased, 69  . Diabetes Brother   . Stroke Brother     SH:  Same sex marriage, non smoker  Review of Systems   All other systems reviewed and are negative.   PHYSICAL EXAMINATION:    BP 114/62 (BP Location: Right Arm, Patient Position: Sitting, Cuff Size: Normal)   Pulse 84   Resp 14   Ht 5' 2.5" (1.588 m)   Wt 178 lb 8 oz (81 kg)   LMP 12/08/2011   BMI 32.13 kg/m     General appearance: alert, cooperative and appears stated age No other physical exam performed.    Findings discussed with pt.  Evaluation reviewed.  Treatment options discussed.  She is not interested in being on medication for this currently.  Would really like to work on improved activity and weight bearing exercise as well as improve diet.  Calcium dosage recommendations and exercise recommendations discussed.  Evaluation for secondary causes discussed as well.  Questions answered.  Assessment: Osteoporosis, no fracture  Plan: PTH with intact calcium, Vit D, Phosphorus levels obtained.  Routine lab work with CBC, CMP, TSH with Dr. Justin Mend recently normal.   If this is all ok, will do a 24 hour urine for calcium.  Instructions given. 1200mg  calcium (total diet and supplement) with Vit D discussed Weight bearing exercise at least three times weekly discussed Repeat BMD two years.   ~25 minutes spent with patient >50% of time was in face to face discussion of above.

## 2017-08-12 ENCOUNTER — Encounter: Payer: Self-pay | Admitting: Obstetrics & Gynecology

## 2017-08-13 LAB — VITAMIN D 1,25 DIHYDROXY
VITAMIN D3 1, 25 (OH): 16 pg/mL
Vitamin D 1, 25 (OH)2 Total: 16 pg/mL — ABNORMAL LOW
Vitamin D2 1, 25 (OH)2: <10 pg/mL

## 2017-08-13 LAB — PTH, INTACT AND CALCIUM
Calcium: 9.3 mg/dL (ref 8.7–10.3)
PTH: 31 pg/mL (ref 15–65)

## 2017-08-13 LAB — PHOSPHORUS: Phosphorus: 2.9 mg/dL (ref 2.5–4.5)

## 2017-08-22 ENCOUNTER — Telehealth: Payer: Self-pay | Admitting: *Deleted

## 2017-08-22 ENCOUNTER — Other Ambulatory Visit: Payer: Self-pay | Admitting: Obstetrics & Gynecology

## 2017-08-22 DIAGNOSIS — M816 Localized osteoporosis [Lequesne]: Secondary | ICD-10-CM

## 2017-08-22 MED ORDER — VITAMIN D (ERGOCALCIFEROL) 1.25 MG (50000 UNIT) PO CAPS: 50000.0000 [IU] | ORAL_CAPSULE | ORAL | 0 refills | Status: DC

## 2017-08-22 NOTE — Telephone Encounter (Signed)
Spoke with patient, advised of results and recommendations as seen below per Dr. Sabra Heck. RX for VIt D to [facility] on file. Lab appt scheduled for 11/29/17 at 9:15am. Patient verbalizes understanding and is agreeable.   Will close encounter.

## 2017-08-22 NOTE — Telephone Encounter (Signed)
Notes recorded by Burnice Logan, RN on 08/22/2017 at 11:08 AM EST Left message to call Sharee Pimple at [phone]. See telephone encounter dated 08/22/17. ------  Notes recorded by Megan Salon, MD on 08/22/2017 at 9:41 AM EST Please let pt know her lab work was fine except Vit D was low. I think she needs to be treated with Vit D 50K weekly for 12 weeks and repeat a VIt D level. Order placed for lab work. I do not think she needs to do the 24 hour urine test. I would see how much her Vit D level improves and then decide about continuing the prescription or changing to OTC again depending on the results. I would repeat a BMD in two years. Thanks.

## 2017-09-10 DIAGNOSIS — F0781 Postconcussional syndrome: Secondary | ICD-10-CM | POA: Insufficient documentation

## 2017-11-06 ENCOUNTER — Other Ambulatory Visit: Payer: Self-pay | Admitting: Obstetrics & Gynecology

## 2017-11-29 ENCOUNTER — Other Ambulatory Visit (INDEPENDENT_AMBULATORY_CARE_PROVIDER_SITE_OTHER): Payer: 59

## 2017-11-29 DIAGNOSIS — M816 Localized osteoporosis [Lequesne]: Secondary | ICD-10-CM

## 2017-11-30 LAB — VITAMIN D 25 HYDROXY (VIT D DEFICIENCY, FRACTURES): Vit D, 25-Hydroxy: 46.7 ng/mL (ref 30.0–100.0)

## 2018-01-30 ENCOUNTER — Encounter: Payer: Self-pay | Admitting: Obstetrics & Gynecology

## 2018-01-31 ENCOUNTER — Encounter: Payer: Self-pay | Admitting: Obstetrics & Gynecology

## 2018-03-20 ENCOUNTER — Encounter: Payer: Self-pay | Admitting: Obstetrics & Gynecology

## 2018-05-02 ENCOUNTER — Other Ambulatory Visit: Payer: Self-pay | Admitting: Obstetrics & Gynecology

## 2018-08-20 NOTE — Progress Notes (Signed)
63 y.o. G0P0 Married White or Caucasian female here for annual exam.  Patient states that she is having some urgency with urination.   Arthritis on hands has really changed in the last month.  Seeing more nodularities on her hands as well.    States she is more active this year than she has been in several.  Really in a good place.    PCP:  Dr. Justin Mend.  Did have blood work earlier this year.  Patient's last menstrual period was 12/08/2011.          Sexually active: No The current method of family planning  post menopausal status.    Exercising: No.  The patient does not participate in regular exercise at present. Smoker:  no  Health Maintenance: Pap:   01/27/16 ASCUS. HR HPV:neg              10/23/13 ASCUS.              02/06/13 Neg. HR HPV:neg  History of abnormal Pap:  Yes LGSIL, long hx of ASCUS paps MMG:  08/07/17 Bi-rads 1 neg density C Colonoscopy:  03/20/18 Normal return in 10 years BMD:   08/06/17 T-score of -2.5 osteoporotic  TDaP:  2017 or 2018 per patient  Pneumonia vaccine(s):  Thinks she's done on Shingrix:   na Hep C testing: never Screening Labs: PCP, Hb today: PCP, Urine today:  yes   reports that she has never smoked. She has never used smokeless tobacco. She reports that she does not drink alcohol or use drugs.  Past Medical History:  Diagnosis Date  . Anxiety   . Arthritis    knees and hands  . Depression   . GERD (gastroesophageal reflux disease)   . Headache(784.0)   . Trigeminal neuralgia     Past Surgical History:  Procedure Laterality Date  . ANKLE ARTHROPLASTY  2008   left  . CARPAL TUNNEL RELEASE  2009   rt  . cortisone injection     total of 12 injections in muscle around neck, right shoulder, and spine  . EAR CYST EXCISION  09/18/2011   Procedure: CYST REMOVAL;  Surgeon: Beckie Salts, MD;  Location: [facility];  Service: ENT;  Laterality: Right;  right facial cyst  . EPIDURAL BLOCK INJECTION     in back and knee  . HAND  ARTHROPLASTY  1989   lt  . JOINT REPLACEMENT  2009   rt total knee  . KNEE ARTHROPLASTY  2010   revision rt total knee  . KNEE ARTHROSCOPY  1973   rt  . PILONIDAL CYST / SINUS EXCISION  1977  . SEPTOPLASTY  2007  . SINUS EXPLORATION  2007  . TONSILLECTOMY    . TRIGGER FINGER RELEASE Left 07/05/2017    Current Outpatient Medications  Medication Sig Dispense Refill  . Biotin 5000 MCG CAPS Take by mouth daily.    Marland Kitchen buPROPion (WELLBUTRIN XL) 300 MG 24 hr tablet Take 300 mg by mouth daily.    Marland Kitchen BUTRANS 10 MCG/HR PTWK patch 1 patch every 7 (seven) days.  1  . CALCIUM PO Take 1 capsule by mouth daily.     . cholecalciferol (VITAMIN D) 1000 units tablet Take 3,000 Units by mouth daily.    . Cyanocobalamin (B-12 PO) Take 1 tablet by mouth daily.     Marland Kitchen escitalopram (LEXAPRO) 20 MG tablet Take 20 mg by mouth daily.      Marland Kitchen HYDROcodone-acetaminophen (NORCO) 10-325 MG per  tablet Take 1 tablet by mouth 5 (five) times daily.     . meclizine (ANTIVERT) 25 MG tablet TAKE 1 TABLET BY MOUTH TWICE DAILY AS NEEDED FOR SEVERE VERTIGO  1  . mirtazapine (REMERON) 45 MG tablet Take 45 mg by mouth at bedtime.    . Multiple Vitamins-Minerals (MULTIVITAMIN PO) Take 1 tablet by mouth daily. Occasionally    . Omega-3 Fatty Acids (FISH OIL PO) Take 1 capsule by mouth daily.     . Turmeric 500 MG TABS Take by mouth daily.    . Vitamin D, Ergocalciferol, (DRISDOL) 50000 units CAPS capsule Take 1 capsule (50,000 Units total) every 7 (seven) days by mouth. 12 capsule 0  . VYVANSE 70 MG capsule Take 1 tablet by mouth daily.  0  . zolpidem (AMBIEN) 10 MG tablet Take 5 mg by mouth at bedtime.      No current facility-administered medications for this visit.     Family History  Problem Relation Age of Onset  . COPD Mother        Deceased, 5  . Heart disease Father        Deceased, 17  . Diabetes Brother   . Stroke Brother     Review of Systems  Genitourinary:       Loss of urin with cough or sneez    Musculoskeletal:       Muscle pain  Psychiatric/Behavioral: Positive for dysphoric mood.    Exam:   BP 130/74   Pulse 66   Resp 14   Ht 5' 2.5" (1.588 m)   Wt 168 lb (76.2 kg)   LMP 12/08/2011   BMI 30.24 kg/m    Height: 5' 2.5" (158.8 cm)  Ht Readings from Last 3 Encounters:  08/29/18 5' 2.5" (1.588 m)  08/09/17 5' 2.5" (1.588 m)  06/20/17 5' 2.5" (1.588 m)    General appearance: alert, cooperative and appears stated age Head: Normocephalic, without obvious abnormality, atraumatic Neck: no adenopathy, supple, symmetrical, trachea midline and thyroid normal to inspection and palpation Lungs: clear to auscultation bilaterally Breasts: normal appearance, no masses or tenderness Heart: regular rate and rhythm Abdomen: soft, non-tender; bowel sounds normal; no masses,  no organomegaly Extremities: extremities normal, atraumatic, no cyanosis or edema Skin: Skin color, texture, turgor normal. No rashes or lesions Lymph nodes: Cervical, supraclavicular, and axillary nodes normal. No abnormal inguinal nodes palpated Neurologic: Grossly normal   Pelvic: External genitalia:  no lesions              Urethra:  normal appearing urethra with no masses, tenderness or lesions              Bartholins and Skenes: normal                 Vagina: normal appearing vagina with normal color and discharge, no lesions              Cervix: no lesions              Pap taken: Yes.   Bimanual Exam:  Uterus:  normal size, contour, position, consistency, mobility, non-tender              Adnexa: normal adnexa and no mass, fullness, tenderness               Rectovaginal: Confirms               Anus:  normal sphincter tone, no lesions  Chaperone was present for exam.  A:  Well Woman with normal exam PMP, on HRT H/o abnormal pap smears H/O depression, stable H/O concussion last year after fall Osteoporosis from BMD 10/18 Joint pain and swelling  P:   Mammogram guidelines reviewed pap smear  obtained today BMD will be obtained next year ANA, ESR, CRP and RF obtained today D/w pt Shingrix vaccination.  Will consider getting this.  Urine culture pending as well.  May need some vaginal estrogen if culture negative vs OAB medications. return annually or prn

## 2018-08-29 ENCOUNTER — Other Ambulatory Visit (HOSPITAL_COMMUNITY)
Admission: RE | Admit: 2018-08-29 | Discharge: 2018-08-29 | Disposition: A | Discharge status: Home / Self Care | Payer: 59 | Source: Ambulatory Visit | Attending: Obstetrics & Gynecology | Admitting: Obstetrics & Gynecology

## 2018-08-29 ENCOUNTER — Ambulatory Visit: Payer: 59 | Admitting: Obstetrics & Gynecology

## 2018-08-29 VITALS — BP 130/74 | HR 66 | Resp 14 | Ht 62.5 in | Wt 168.0 lb

## 2018-08-29 DIAGNOSIS — R3915 Urgency of urination: Secondary | ICD-10-CM

## 2018-08-29 DIAGNOSIS — Z01419 Encounter for gynecological examination (general) (routine) without abnormal findings: Secondary | ICD-10-CM

## 2018-08-29 DIAGNOSIS — Z124 Encounter for screening for malignant neoplasm of cervix: Secondary | ICD-10-CM

## 2018-08-29 DIAGNOSIS — M2550 Pain in unspecified joint: Secondary | ICD-10-CM

## 2018-08-29 NOTE — Patient Instructions (Signed)
[facility] [place]  Address: [address], [place], [place] [address]  Phone: [phone]  

## 2018-08-30 ENCOUNTER — Encounter: Payer: Self-pay | Admitting: Obstetrics & Gynecology

## 2018-08-30 LAB — CYTOLOGY - PAP: Diagnosis: NEGATIVE

## 2018-08-30 LAB — SEDIMENTATION RATE: SED RATE: 7 mm/h (ref 0–40)

## 2018-08-30 LAB — C-REACTIVE PROTEIN: CRP: 4 mg/L (ref 0–10)

## 2018-08-30 LAB — ANA: Anti Nuclear Antibody(ANA): NEGATIVE

## 2018-08-30 LAB — RHEUMATOID FACTOR: Rhuematoid fact SerPl-aCnc: <10 IU/mL (ref 0.0–13.9)

## 2018-08-31 LAB — URINE CULTURE

## 2018-12-13 ENCOUNTER — Encounter: Payer: Self-pay | Admitting: Neurology

## 2019-02-03 ENCOUNTER — Encounter: Payer: Self-pay | Admitting: Neurology

## 2019-02-17 ENCOUNTER — Encounter: Payer: Self-pay | Admitting: *Deleted

## 2019-02-17 NOTE — Progress Notes (Signed)
New Patient Virtual Visit via Video Note The purpose of this virtual visit is to provide medical care while limiting exposure to the novel coronavirus.    Consent was obtained for video visit:  Yes.   Answered questions that patient had about telehealth interaction:  Yes.   I discussed the limitations, risks, security and privacy concerns of performing an evaluation and management service by telemedicine. I also discussed with the patient that there may be a patient responsible charge related to this service. The patient expressed understanding and agreed to proceed.  Pt location: Home Physician Location: office Name of referring provider:  Maurice Small, MD I connected with Gaetana Michaelis Mcnair at patients initiation/request on 02/18/2019 at 10:00 AM EDT by video enabled telemedicine application and verified that I am speaking with the correct person using two identifiers. Pt MRN:  [id number] Pt DOB:  09/23/1956 Video Participants:  Gaetana Michaelis Sperl    History of Present Illness: Brittney [name] is a 63 y.o. right-handed Caucasian female with depression, GERD, right trigeminal neuralgia s/p RFA (followed by Dr. Vertell Limber), right CTS s/p release, chronic pain, ADD, hyperlipidemia, and migraines presenting for evaluation of post-concussive syndrome.   In June 2018, she suffered a fall down her stairs and hitting her head on the concrete steps.  Fortunately, there was no laceration or loss of consciousness.  She was shaken by the event and rested for the next two days until seeing her PCP because of dizziness and difficulty concentrating.  Dizziness was provoked by head and eye movements. Around the same time, she began having tinnitus in the right ear. A few weeks later, she had a spell of vertigo which was treated with meclizine.  She continued to have difficulty with thinking so had a CT head which was normal.  In August 2018, she was referred to vestibular therapy which helped her vertigo.  For  one year following the event, she has become more fearful of moving, driving, or staying active.  She saw Dr. Constance Holster, ENT, who suggested she developed traumatic tinnitus from her fall.  There was no sign of ongoing vertigo. Mood has been low and she is very anxious.  She was doing relatively well until 10 days ago, when she reports making abrupt head movement while working in the garden and having recurrence of dizzy spells.  Dizziness is again worse with positional changes.  She does not have nausea or vomiting.  She has not done any vestibular exercises, due to fear of making her symptoms worse if done incorrectly.  Out-side paper records, electronic medical record, and images have been reviewed where available and summarized as:  Lab Results  Component Value Date   HGBA1C 5.7 (H) 01/27/2016   Lab Results  Component Value Date   VITAMINB12 1,112 (H) 07/10/2014   Lab Results  Component Value Date   TSH 1.90 01/27/2016   Lab Results  Component Value Date   ESRSEDRATE 7 08/29/2018    Past Medical History:  Diagnosis Date   Anxiety    Arthritis    knees and hands   Depression    GERD (gastroesophageal reflux disease)    Headache(784.0)    Trigeminal neuralgia     Past Surgical History:  Procedure Laterality Date   ANKLE ARTHROPLASTY  2008   left   CARPAL TUNNEL RELEASE  2009   rt   cortisone injection     total of 12 injections in muscle around neck, right shoulder, and spine   EAR  CYST EXCISION  09/18/2011   Procedure: CYST REMOVAL;  Surgeon: Beckie Salts, MD;  Location: [facility];  Service: ENT;  Laterality: Right;  right facial cyst   EPIDURAL BLOCK INJECTION     in back and knee   HAND ARTHROPLASTY  1989   lt   JOINT REPLACEMENT  2009   rt total knee   KNEE ARTHROPLASTY  2010   revision rt total knee   KNEE ARTHROSCOPY  1973   rt   PILONIDAL CYST / SINUS EXCISION  1977   SEPTOPLASTY  2007   SINUS EXPLORATION  2007    TONSILLECTOMY     TRIGGER FINGER RELEASE Left 07/05/2017     Medications:  Outpatient Encounter Medications as of 02/18/2019  Medication Sig Note   Biotin 5000 MCG CAPS Take by mouth daily.    Buprenorphine HCl (BELBUCA) 600 MCG FILM Belbuca 600 mcg buccal film    buPROPion (WELLBUTRIN XL) 300 MG 24 hr tablet Take 300 mg by mouth daily.    CALCIUM PO Take 1 capsule by mouth daily.     cholecalciferol (VITAMIN D) 1000 units tablet Take 3,000 Units by mouth daily.    Cyanocobalamin (B-12 PO) Take 1 tablet by mouth daily.     escitalopram (LEXAPRO) 20 MG tablet Take 20 mg by mouth daily.      HYDROcodone-acetaminophen (NORCO) 10-325 MG per tablet Take 1 tablet by mouth 5 (five) times daily.     mirtazapine (REMERON) 45 MG tablet Take 45 mg by mouth at bedtime.    Multiple Vitamins-Minerals (MULTIVITAMIN PO) Take 1 tablet by mouth daily. Occasionally    Omega-3 Fatty Acids (FISH OIL PO) Take 1 capsule by mouth daily.     VYVANSE 70 MG capsule Take 1 tablet by mouth daily. 01/27/2016: Received from: External Pharmacy Received Sig: TAKE 1 CAPSULE BY MOUTH EVERY MORNING CAN FILL 3.16   zolpidem (AMBIEN) 10 MG tablet Take 5 mg by mouth at bedtime.     [DISCONTINUED] BUTRANS 10 MCG/HR PTWK patch 1 patch every 7 (seven) days. 01/27/2016: Received from: External Pharmacy Received Sig: APPLY 1 PATCH TOPICALLY EVERY 7 DAYS AS DIRECTED   [DISCONTINUED] meclizine (ANTIVERT) 25 MG tablet TAKE 1 TABLET BY MOUTH TWICE DAILY AS NEEDED FOR SEVERE VERTIGO    [DISCONTINUED] Turmeric 500 MG TABS Take by mouth daily.    [DISCONTINUED] Vitamin D, Ergocalciferol, (DRISDOL) 50000 units CAPS capsule Take 1 capsule (50,000 Units total) every 7 (seven) days by mouth.    No facility-administered encounter medications on file as of 02/18/2019.     Allergies:  Allergies  Allergen Reactions   Asa Arthritis Strength-Antacid [Aspirin Buffered]    Carbamazepine Other (See Comments)   Celebrex  [Celecoxib] Other (See Comments)    Stomach pain   Cephalexin    Chlorpromazine Hcl Other (See Comments)   Cymbalta [Duloxetine Hcl] Other (See Comments)   Darvocet [Propoxyphene N-Acetaminophen]    Dexilant [Dexlansoprazole] Other (See Comments)   Diazepam Other (See Comments)    migraines   Dilantin [Phenytoin Sodium Extended] Other (See Comments)    Zombie like feeling   Klonopin [Clonazepam] Other (See Comments)    Makes her feel like a zombie   Lamictal [Lamotrigine]    Lyrica [Pregabalin]    Neurontin [Gabapentin] Other (See Comments)   Nubain [Nalbuphine Hcl]     Spasms-itching   Oxcarbazepine     Increase depression/severe constipation   Phenytoin Sodium Extended     Other reaction(s): Other (See Comments)  Zombie like feeling   Prednisone Other (See Comments)    Headaches   Propoxyphene     Other reaction(s): Other (See Comments)   Thorazine [Chlorpromazine Hcl]    Tolmetin     Other reaction(s): Other (See Comments) Gi upset   Topamax [Topiramate] Photosensitivity    Family History: Family History  Problem Relation Age of Onset   COPD Mother        Deceased, 7   Heart disease Father        Deceased, 23   Diabetes Brother    Stroke Brother     Social History: Social History   Tobacco Use   Smoking status: Never Smoker   Smokeless tobacco: Never Used  Substance Use Topics   Alcohol use: No    Alcohol/week: 0.0 standard drinks   Drug use: No   Social History   Social History Narrative   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   She is in the process of applying for disability.   She is lives with partner.   Highest level of education:  B.A.   Lives in a 2 story home.      Review of Systems:  CONSTITUTIONAL: No fevers, chills, night sweats, or weight loss.   EYES: No visual changes or eye pain ENT: No hearing changes.  No history of nose bleeds.   RESPIRATORY: No cough, wheezing and  shortness of breath.   CARDIOVASCULAR: Negative for chest pain, and palpitations.   GI: Negative for abdominal discomfort, blood in stools or black stools.  No recent change in bowel habits.   GU:  No history of incontinence.   MUSCLOSKELETAL: No history of joint pain or swelling.  No myalgias.   SKIN: Negative for lesions, rash, and itching.   HEMATOLOGY/ONCOLOGY: Negative for prolonged bleeding, bruising easily, and swollen nodes.  No history of cancer.   ENDOCRINE: Negative for cold or heat intolerance, polydipsia or goiter.   PSYCH:  +depression ++anxiety symptoms.   NEURO: As Above.   Vital Signs:  Ht 5' 2.5" (1.588 m)    Wt 158 lb (71.7 kg)    LMP 12/08/2011    BMI 28.44 kg/m    General Medical Exam:  Well appearing, slightly anxious.  Nonlabored breathing.    Neurological Exam: MENTAL STATUS including orientation to time, place, person, recent and remote memory, attention span and concentration, language, and fund of knowledge is normal.  Speech is not dysarthric.  CRANIAL NERVES:  Normal conjugate, extra-ocular eye movements in all directions of gaze.  I do not observe any nystagmus. No ptosis.  Normal facial symmetry and movements.  Normal shoulder shrug and head rotation.  Tongue is midline.  MOTOR:  Antigravity in all extremities.  No abnormal movements.  No pronator drift.   COORDINATION/GAIT: Normal finger to nose bilaterally.  Intact rapid alternating movements bilaterally.  Able to rise from a chair without using arms.  Gait narrow based and stable. She is able to stand on heels and toes.*   IMPRESSION/PLAN: 1.  Benign paroxysmal positional vertigo.  Patient was informed that with her history of vertigo, these symptoms can recur and do tend to be responsive to vestibular exercises.  She expresses interest in restarting vestibular physical therapy.  She did not have any benefit with meclizine and tends to be intolerant to medications.  2.  Postconcussive syndrome with  vertigo and increased anxiety.  Fortunately, she is doing significantly better and has improved memory and tinnitus.  However, she remains very fearful and lacks confidence in herself to be able to do usual level of activity.  She has a significant amount of anxiety related to her fall, which is contributing to her overall sense of being unwell.  She is concerned that she is not return to her usual baseline and continues to be symptomatic.  I will obtain MRI brain without contrast to be sure there is no underlying structural pathology. I had an extensive discussion with patient regarding the natural course of concussion and it is reassuring that she is doing better.  Strategies to such as mindfulness was discussed with patient to help with her anxiety.  She may benefit from seeing a psychiatrist, which I will defer to her PCP who is currently managing her depression.  Patient had many questions today about her chronic pain, depression, as well as the above-mentioned symptoms.  I addressed each to the best of my ability and asked her to follow-up with her respective providers for conditions beyond my expertise.  Follow Up Instructions:  I discussed the assessment and treatment plan with the patient. The patient was provided an opportunity to ask questions and all were answered. The patient agreed with the plan and demonstrated an understanding of the instructions.   The patient was advised to call back or seek an in-person evaluation if the symptoms worsen or if the condition fails to improve as anticipated.  Return to clinic after testing  Total Time spent:  60 min   Alda Berthold, DO

## 2019-02-18 ENCOUNTER — Encounter: Payer: Self-pay | Admitting: *Deleted

## 2019-02-18 ENCOUNTER — Other Ambulatory Visit: Payer: Self-pay

## 2019-02-18 ENCOUNTER — Telehealth (INDEPENDENT_AMBULATORY_CARE_PROVIDER_SITE_OTHER): Payer: No Typology Code available for payment source | Admitting: Neurology

## 2019-02-18 ENCOUNTER — Telehealth: Payer: Self-pay

## 2019-02-18 ENCOUNTER — Encounter: Payer: Self-pay | Admitting: Neurology

## 2019-02-18 VITALS — Ht 62.5 in | Wt 158.0 lb

## 2019-02-18 DIAGNOSIS — F0781 Postconcussional syndrome: Secondary | ICD-10-CM

## 2019-02-18 DIAGNOSIS — H8110 Benign paroxysmal vertigo, unspecified ear: Secondary | ICD-10-CM

## 2019-02-18 DIAGNOSIS — F418 Other specified anxiety disorders: Secondary | ICD-10-CM

## 2019-02-18 NOTE — Telephone Encounter (Signed)
Pt made aware that [facility] will be calling her in regards to her MRI of the brain.  I offered to schedule pt at [facility] for tomorrow, but she is unable to do that. Pt will call the PT office to schedule on her own. Phone number given.  I spoke with Hinton Dyer at [facility] and she requested that I send pt's ins card and last office note. Fax number [phone].

## 2019-02-24 ENCOUNTER — Ambulatory Visit: Payer: 59 | Admitting: Neurology

## 2019-02-27 ENCOUNTER — Other Ambulatory Visit: Payer: Self-pay | Admitting: Family Medicine

## 2019-02-27 DIAGNOSIS — Z1231 Encounter for screening mammogram for malignant neoplasm of breast: Secondary | ICD-10-CM

## 2019-03-06 ENCOUNTER — Ambulatory Visit: Payer: No Typology Code available for payment source

## 2019-03-29 ENCOUNTER — Ambulatory Visit
Admission: RE | Admit: 2019-03-29 | Discharge: 2019-03-29 | Disposition: A | Discharge status: Home / Self Care | Payer: No Typology Code available for payment source | Source: Ambulatory Visit | Attending: Family Medicine | Admitting: Family Medicine

## 2019-03-29 ENCOUNTER — Other Ambulatory Visit: Payer: Self-pay

## 2019-03-29 DIAGNOSIS — Z1231 Encounter for screening mammogram for malignant neoplasm of breast: Secondary | ICD-10-CM

## 2019-04-07 ENCOUNTER — Ambulatory Visit
Admission: RE | Admit: 2019-04-07 | Discharge: 2019-04-07 | Disposition: A | Discharge status: Home / Self Care | Payer: No Typology Code available for payment source | Source: Ambulatory Visit | Attending: Neurology | Admitting: Neurology

## 2019-04-07 DIAGNOSIS — F0781 Postconcussional syndrome: Secondary | ICD-10-CM

## 2019-04-07 DIAGNOSIS — H8110 Benign paroxysmal vertigo, unspecified ear: Secondary | ICD-10-CM

## 2019-04-08 ENCOUNTER — Telehealth: Payer: Self-pay

## 2019-04-08 NOTE — Telephone Encounter (Signed)
-----   Message from Alda Berthold, DO sent at 04/08/2019  8:38 AM EDT ----- Please reassure patient that her MRI brain looks great and is normal.  There is no structural injury that is present.  Symptoms are due to post-concussion syndrome and will continue to improve - it is important to remain active and to work with her PCP/psychiatrist for depression.  Thanks.

## 2019-04-08 NOTE — Telephone Encounter (Signed)
Informed patient of MRI results and recommendations.

## 2019-04-09 HISTORY — PX: CARPOMETACARPAL (CMC) FUSION OF THUMB: SHX6290

## 2019-04-25 ENCOUNTER — Other Ambulatory Visit: Payer: Self-pay | Admitting: Internal Medicine

## 2019-04-25 DIAGNOSIS — Z20822 Contact with and (suspected) exposure to covid-19: Secondary | ICD-10-CM

## 2019-04-30 LAB — NOVEL CORONAVIRUS, NAA: SARS-CoV-2, NAA: NOT DETECTED

## 2019-05-06 HISTORY — PX: JOINT REPLACEMENT: SHX530

## 2019-10-17 ENCOUNTER — Other Ambulatory Visit: Payer: Self-pay

## 2019-10-21 ENCOUNTER — Ambulatory Visit: Payer: No Typology Code available for payment source | Admitting: Obstetrics & Gynecology

## 2019-10-21 ENCOUNTER — Other Ambulatory Visit: Payer: Self-pay

## 2019-10-21 NOTE — Progress Notes (Deleted)
64 y.o. G0P0 Married White or Caucasian female here for annual exam.    Patient's last menstrual period was 12/08/2011.          Sexually active: {yes no:314532}  The current method of family planning is post menopausal status.    Exercising: {yes no:314532}  {types:19826} Smoker:  no  Health Maintenance: Pap:   08/29/18 Neg  06/20/17 Neg:Neg HR HPV  01/27/16 ASCUS. HR HPV:neg 10/23/13 ASCUS.  02/06/13 Neg. HR HPV:neg  History of abnormal Pap:   Yes LGSIL, long hx of ASCUS paps MMG:  03/29/19 BIRADS 1 negative/density b Colonoscopy:   03/20/18 Normal return in 10 years BMD:   08/06/17 T-score of -2.5 osteoporotic  TDaP:  01/30/18 Pneumonia vaccine(s):  *** Shingrix:   *** Hep C testing: *** Screening Labs: ***   reports that she has never smoked. She has never used smokeless tobacco. She reports that she does not drink alcohol or use drugs.  Past Medical History:  Diagnosis Date  . Anxiety   . Arthritis    knees and hands  . Depression   . GERD (gastroesophageal reflux disease)   . Headache(784.0)   . Trigeminal neuralgia     Past Surgical History:  Procedure Laterality Date  . ANKLE ARTHROPLASTY  2008   left  . CARPAL TUNNEL RELEASE  2009   rt  . cortisone injection     total of 12 injections in muscle around neck, right shoulder, and spine  . EAR CYST EXCISION  09/18/2011   Procedure: CYST REMOVAL;  Surgeon: Beckie Salts, MD;  Location: [facility];  Service: ENT;  Laterality: Right;  right facial cyst  . EPIDURAL BLOCK INJECTION     in back and knee  . HAND ARTHROPLASTY  1989   lt  . JOINT REPLACEMENT  2009   rt total knee  . KNEE ARTHROPLASTY  2010   revision rt total knee  . KNEE ARTHROSCOPY  1973   rt  . PILONIDAL CYST / SINUS EXCISION  1977  . SEPTOPLASTY  2007  . SINUS EXPLORATION  2007  . TONSILLECTOMY    . TRIGGER FINGER RELEASE Left 07/05/2017    Current Outpatient Medications  Medication Sig Dispense Refill   . Biotin 5000 MCG CAPS Take by mouth daily.    . Buprenorphine HCl (BELBUCA) 600 MCG FILM Belbuca 600 mcg buccal film    . buPROPion (WELLBUTRIN XL) 300 MG 24 hr tablet Take 300 mg by mouth daily.    Marland Kitchen CALCIUM PO Take 1 capsule by mouth daily.     . cholecalciferol (VITAMIN D) 1000 units tablet Take 3,000 Units by mouth daily.    . Cyanocobalamin (B-12 PO) Take 1 tablet by mouth daily.     Marland Kitchen escitalopram (LEXAPRO) 20 MG tablet Take 20 mg by mouth daily.      Marland Kitchen HYDROcodone-acetaminophen (NORCO) 10-325 MG per tablet Take 1 tablet by mouth 5 (five) times daily.     . mirtazapine (REMERON) 45 MG tablet Take 45 mg by mouth at bedtime.    . Multiple Vitamins-Minerals (MULTIVITAMIN PO) Take 1 tablet by mouth daily. Occasionally    . Omega-3 Fatty Acids (FISH OIL PO) Take 1 capsule by mouth daily.     Marland Kitchen VYVANSE 70 MG capsule Take 1 tablet by mouth daily.  0  . zolpidem (AMBIEN) 10 MG tablet Take 5 mg by mouth at bedtime.      No current facility-administered medications for this visit.  Family History  Problem Relation Age of Onset  . COPD Mother        Deceased, 29  . Heart disease Father        Deceased, 35  . Diabetes Brother   . Stroke Brother     Review of Systems  Exam:   LMP 12/08/2011   Height:      Ht Readings from Last 3 Encounters:  02/18/19 5' 2.5" (1.588 m)  08/29/18 5' 2.5" (1.588 m)  08/09/17 5' 2.5" (1.588 m)    General appearance: alert, cooperative and appears stated age Head: Normocephalic, without obvious abnormality, atraumatic Neck: no adenopathy, supple, symmetrical, trachea midline and thyroid {EXAM; THYROID:18604} Lungs: clear to auscultation bilaterally Breasts: {Exam; breast:13139::"normal appearance, no masses or tenderness"} Heart: regular rate and rhythm Abdomen: soft, non-tender; bowel sounds normal; no masses,  no organomegaly Extremities: extremities normal, atraumatic, no cyanosis or edema Skin: Skin color, texture, turgor normal. No rashes  or lesions Lymph nodes: Cervical, supraclavicular, and axillary nodes normal. No abnormal inguinal nodes palpated Neurologic: Grossly normal   Pelvic: External genitalia:  no lesions              Urethra:  normal appearing urethra with no masses, tenderness or lesions              Bartholins and Skenes: normal                 Vagina: normal appearing vagina with normal color and discharge, no lesions              Cervix: {exam; cervix:14595}              Pap taken: {yes no:314532} Bimanual Exam:  Uterus:  {exam; uterus:12215}              Adnexa: {exam; adnexa:12223}               Rectovaginal: Confirms               Anus:  normal sphincter tone, no lesions  Chaperone, ***Terence Lux, CMA, was present for exam.  A:  Well Woman with normal exam  P:   {plan; gyn:5269::"mammogram","pap smear","return annually or prn"}

## 2019-10-28 ENCOUNTER — Other Ambulatory Visit (HOSPITAL_COMMUNITY)
Admission: RE | Admit: 2019-10-28 | Discharge: 2019-10-28 | Disposition: A | Discharge status: Home / Self Care | Payer: No Typology Code available for payment source | Source: Ambulatory Visit | Attending: Obstetrics & Gynecology | Admitting: Obstetrics & Gynecology

## 2019-10-28 ENCOUNTER — Other Ambulatory Visit: Payer: Self-pay

## 2019-10-28 ENCOUNTER — Encounter: Payer: Self-pay | Admitting: Obstetrics & Gynecology

## 2019-10-28 ENCOUNTER — Ambulatory Visit: Payer: No Typology Code available for payment source | Admitting: Obstetrics & Gynecology

## 2019-10-28 VITALS — BP 128/70 | HR 80 | Temp 97.7°F | Resp 10 | Ht 62.25 in | Wt 165.8 lb

## 2019-10-28 DIAGNOSIS — M818 Other osteoporosis without current pathological fracture: Secondary | ICD-10-CM

## 2019-10-28 DIAGNOSIS — Z124 Encounter for screening for malignant neoplasm of cervix: Secondary | ICD-10-CM

## 2019-10-28 DIAGNOSIS — R87610 Atypical squamous cells of undetermined significance on cytologic smear of cervix (ASC-US): Secondary | ICD-10-CM | POA: Insufficient documentation

## 2019-10-28 DIAGNOSIS — Z01419 Encounter for gynecological examination (general) (routine) without abnormal findings: Secondary | ICD-10-CM | POA: Insufficient documentation

## 2019-10-28 NOTE — Progress Notes (Signed)
64 y.o. G0P0 Married White or Caucasian female here for annual exam.  Had thumb joint replacement surgery.  Still having a lot of pain.  Movement is good.  Denies vaginal bleeding.     Having worsening depression this year.  Is not seeing a psychiatrist at this point.  Is having some obsessive symptoms that she knows are not dangerous.  Would like some suggestions.  Patient's last menstrual period was 12/08/2011.          Sexually active: No.  The current method of family planning is none.    Exercising: No.  The patient does not participate in regular exercise at present. Smoker:  no  Health Maintenance: Pap:   08/29/18 Neg  01/27/16 ASCUS. HR HPV:neg 10/23/13 ASCUS.  02/06/13 Neg. HR HPV:neg  History of abnormal Pap:  Yes LGSIL, long hx of ASCUS paps MMG:  03/29/19 BIRADS 1 negative/density b Colonoscopy:  03/20/18 f/u 10 years BMD:   08/06/17 Osteoporosis TDaP:  01/30/18 Pneumonia vaccine(s):  Per patient has had one vaccine before Shingrix:   Discussed with pt Hep C testing: 01/27/16 Neg Screening Labs: PCP   reports that she has never smoked. She has never used smokeless tobacco. She reports that she does not drink alcohol or use drugs.  Past Medical History:  Diagnosis Date  . Anxiety   . Arthritis    knees and hands  . Depression   . GERD (gastroesophageal reflux disease)   . Headache(784.0)   . Trigeminal neuralgia     Past Surgical History:  Procedure Laterality Date  . ANKLE ARTHROPLASTY  2008   left  . CARPAL TUNNEL RELEASE  2009   rt  . CARPOMETACARPAL (CMC) FUSION OF THUMB Left 04/2019  . cortisone injection     total of 12 injections in muscle around neck, right shoulder, and spine  . EAR CYST EXCISION  09/18/2011   Procedure: CYST REMOVAL;  Surgeon: Beckie Salts, MD;  Location: [facility];  Service: ENT;  Laterality: Right;  right facial cyst  . EPIDURAL BLOCK INJECTION     in back and knee  . HAND ARTHROPLASTY  1989    lt  . JOINT REPLACEMENT  2009   rt total knee  . KNEE ARTHROPLASTY  2010   revision rt total knee  . KNEE ARTHROSCOPY  1973   rt  . PILONIDAL CYST / SINUS EXCISION  1977  . SEPTOPLASTY  2007  . SINUS EXPLORATION  2007  . TONSILLECTOMY    . TRIGGER FINGER RELEASE Left 07/05/2017    Current Outpatient Medications  Medication Sig Dispense Refill  . Biotin 5000 MCG CAPS Take by mouth daily.    . Buprenorphine HCl (BELBUCA) 600 MCG FILM Belbuca 600 mcg buccal film    . buPROPion (WELLBUTRIN XL) 300 MG 24 hr tablet Take 300 mg by mouth daily.    Marland Kitchen CALCIUM PO Take 1 capsule by mouth daily.     . cholecalciferol (VITAMIN D) 1000 units tablet Take 3,000 Units by mouth daily.    . Cyanocobalamin (B-12 PO) Take 1 tablet by mouth daily.     Marland Kitchen escitalopram (LEXAPRO) 20 MG tablet Take 20 mg by mouth daily.      Marland Kitchen HYDROcodone-acetaminophen (NORCO) 10-325 MG per tablet Take 1 tablet by mouth 5 (five) times daily.     . mirtazapine (REMERON) 45 MG tablet Take 45 mg by mouth at bedtime.    . Multiple Vitamins-Minerals (MULTIVITAMIN PO) Take 1 tablet by mouth  daily. Occasionally    . Omega-3 Fatty Acids (FISH OIL PO) Take 1 capsule by mouth daily.     Marland Kitchen tiZANidine (ZANAFLEX) 4 MG tablet Take 2 mg by mouth every 8 (eight) hours as needed.    Marland Kitchen VYVANSE 70 MG capsule Take 1 tablet by mouth daily.  0  . zolpidem (AMBIEN) 10 MG tablet Take 5 mg by mouth at bedtime.      No current facility-administered medications for this visit.    Family History  Problem Relation Age of Onset  . COPD Mother        Deceased, 14  . Heart disease Father        Deceased, 18  . Diabetes Brother   . Stroke Brother     Review of Systems  All other systems reviewed and are negative.   Exam:   BP 128/70 (BP Location: Right Arm, Patient Position: Sitting, Cuff Size: Normal)   Pulse 80   Temp 97.7 F (36.5 C) (Temporal)   Resp 10   Ht 5' 2.25" (1.581 m)   Wt 165 lb 12.8 oz (75.2 kg)   LMP 12/08/2011   BMI  30.08 kg/m   Height: 5' 2.25" (158.1 cm)  Ht Readings from Last 3 Encounters:  10/28/19 5' 2.25" (1.581 m)  02/18/19 5' 2.5" (1.588 m)  08/29/18 5' 2.5" (1.588 m)    General appearance: alert, cooperative and appears stated age Head: Normocephalic, without obvious abnormality, atraumatic Neck: no adenopathy, supple, symmetrical, trachea midline and thyroid normal to inspection and palpation Lungs: clear to auscultation bilaterally Breasts: normal appearance, no masses or tenderness Heart: regular rate and rhythm Abdomen: soft, non-tender; bowel sounds normal; no masses,  no organomegaly Extremities: extremities normal, atraumatic, no cyanosis or edema Skin: Skin color, texture, turgor normal. No rashes or lesions Lymph nodes: Cervical, supraclavicular, and axillary nodes normal. No abnormal inguinal nodes palpated Neurologic: Grossly normal   Pelvic: External genitalia:  no lesions              Urethra:  normal appearing urethra with no masses, tenderness or lesions              Bartholins and Skenes: normal                 Vagina: normal appearing vagina with normal color and discharge, no lesions              Cervix: no lesions              Pap taken: Yes.   Bimanual Exam:  Uterus:  normal size, contour, position, consistency, mobility, non-tender              Adnexa: normal adnexa and no mass, fullness, tenderness               Rectovaginal: Confirms               Anus:  normal sphincter tone, no lesions  Chaperone, Terence Lux, CMA, was present for exam.  A:  Well Woman with normal exam PMP, no HRRT ASCUS pap with neg HR HPV H/o depression Osteoporosis with BMD 10/18 H/o concussion about two years ago Pain management, Dr. Clydell Hakim manages  P:   Mammogram guidelines reviewed pap smear and HR HPV obtained today BMD due this year.  Order placed.  Pt will schedule this with [facility]. Name for psychiatrist given Lab work obtained with Dr. Justin Mend in 2020. Return  annually or prn

## 2019-10-28 NOTE — Patient Instructions (Signed)
Dr. Pearson Grippe  Our Office [facility] [address], [place], [place] [address] [phone]

## 2019-10-30 LAB — CYTOLOGY - PAP
Comment: NEGATIVE
Diagnosis: NEGATIVE
High risk HPV: NEGATIVE

## 2019-12-16 ENCOUNTER — Other Ambulatory Visit: Payer: Self-pay | Admitting: Family Medicine

## 2019-12-16 DIAGNOSIS — Z1231 Encounter for screening mammogram for malignant neoplasm of breast: Secondary | ICD-10-CM

## 2020-01-02 ENCOUNTER — Ambulatory Visit: Payer: 59 | Admitting: Obstetrics & Gynecology

## 2020-03-29 ENCOUNTER — Other Ambulatory Visit: Payer: Self-pay

## 2020-03-29 ENCOUNTER — Ambulatory Visit
Admission: RE | Admit: 2020-03-29 | Discharge: 2020-03-29 | Disposition: A | Discharge status: Home / Self Care | Payer: No Typology Code available for payment source | Source: Ambulatory Visit | Attending: Family Medicine | Admitting: Family Medicine

## 2020-03-29 ENCOUNTER — Ambulatory Visit
Admission: RE | Admit: 2020-03-29 | Discharge: 2020-03-29 | Disposition: A | Discharge status: Home / Self Care | Payer: No Typology Code available for payment source | Source: Ambulatory Visit | Attending: Obstetrics & Gynecology | Admitting: Obstetrics & Gynecology

## 2020-03-29 DIAGNOSIS — M818 Other osteoporosis without current pathological fracture: Secondary | ICD-10-CM

## 2020-03-29 DIAGNOSIS — Z1231 Encounter for screening mammogram for malignant neoplasm of breast: Secondary | ICD-10-CM

## 2020-04-23 NOTE — Progress Notes (Signed)
Subjective:    52 yrs Married Caucasian G0P0  female here to discuss recent BMD obtained 03/29/2020 showing osteoporosis with T score of -2.6  Last BMD prior to this was 08/06/2017 showed worse T score in right femur was -2.5.  She does get calcium with whey protein and cashew/soy milk, and also ice cream.  She is taking 650mg  calcium daily with Viactiv chews.     Osteoporosis Risk Factors  Nonmodifiable Personal Hx of fracture as an adult: yes - toe fracture from stubbing her toe Hx of fracture in first-degree relative: yes - mother fractured her humerus after a fall (mother was diagnosed with osteoporosis) Caucasian race: yes Advanced age: no Female sex: yes Dementia: no Poor health/frailty: no  Potentially modifiable: Tobacco use: no Low body weight (<127 lbs): no Estrogen deficiency  early menopause (age <45) or bilateral ovariectomy: no  prolonged premenopausal amenorrhea (>1 yr): no Low calcium intake (lifelong): no Alcohol use more than 2 drinks per day: no Recurrent falls: no Inadequate physical activity: yes  Current calcium and Vit D intake:  650mg  calcium with 1000 Vit D  Review of Systems A comprehensive review of systems was negative.     Objective:   PHYSICAL EXAM BP 120/76   Pulse 68   Resp 16   Wt 152 lb (68.9 kg)   LMP 12/08/2011   BMI 27.58 kg/m  General appearance: alert, cooperative and no distress  Imaging Bone Density: Spine T Score: -2.1, Hip T Score: -2.6                        Assessment:   Osteoporosis with T score -2.6   Plan:   1.  Patient counseled in adequate calcium and vitamin D exposure.  Calcium - 500 - 1000 mg elemental calcium/day in divided doses  Vitamin D - 800 IU/day 2.  Exercise recommended at least 30 minutes 3 times per week.  3.  Recommendation to avoid heavy ETOH use.  4.  Counseled to avoid tobacco and alcohol 5.  Fall prevention discussed. 6.  Pharmacologic therapy therapy below discussed including risks  and benefits:   Bisphosphonates po (Fosamax, Actonel, Boniva)  Bisphosphonate IV (Reclast)  Evista  Prolia subcutaneous   Forteo subcutaneous We decided not to start any medication this year.   7.  Repeat bone density in 2 years.  About 25 minutes spent in total with pt

## 2020-04-26 ENCOUNTER — Ambulatory Visit: Payer: No Typology Code available for payment source | Admitting: Obstetrics & Gynecology

## 2020-04-26 ENCOUNTER — Other Ambulatory Visit: Payer: Self-pay

## 2020-04-26 ENCOUNTER — Encounter: Payer: Self-pay | Admitting: Obstetrics & Gynecology

## 2020-04-26 VITALS — BP 120/76 | HR 68 | Resp 16 | Wt 152.0 lb

## 2020-04-26 DIAGNOSIS — M818 Other osteoporosis without current pathological fracture: Secondary | ICD-10-CM

## 2020-05-17 ENCOUNTER — Encounter: Payer: Self-pay | Admitting: Psychology

## 2020-05-27 ENCOUNTER — Encounter: Payer: No Typology Code available for payment source | Attending: Psychology | Admitting: Psychology

## 2020-05-27 ENCOUNTER — Other Ambulatory Visit: Payer: Self-pay

## 2020-05-27 ENCOUNTER — Encounter: Payer: Self-pay | Admitting: Psychology

## 2020-05-27 DIAGNOSIS — H8111 Benign paroxysmal vertigo, right ear: Secondary | ICD-10-CM | POA: Diagnosis present

## 2020-05-27 DIAGNOSIS — F331 Major depressive disorder, recurrent, moderate: Secondary | ICD-10-CM

## 2020-05-27 DIAGNOSIS — G894 Chronic pain syndrome: Secondary | ICD-10-CM | POA: Diagnosis present

## 2020-05-27 DIAGNOSIS — F0781 Postconcussional syndrome: Secondary | ICD-10-CM | POA: Diagnosis present

## 2020-05-27 NOTE — Progress Notes (Signed)
Neuropsychological Consultation   Patient:   Brittney [name]   DOB:   1956/04/20  MR Number:  [id number]  Location:  [facility] [place], [place] [id number] [address] [place] [address] Dept: [phone]           Date of Service:   05/27/2020  Start Time:   3 PM End Time:   5 PM  Today's visit was an in person visit that was conducted in my outpatient clinic office that was conducted with myself and the patient present.  1 hour and 15 minutes was spent with the patient in face-to-face clinical interview and 45 minutes were spent in records review and report writing.  Provider/Observer:  Ilean Skill, Psy.D.       Clinical Neuropsychologist       Billing Code/Service: 96116/96121  Chief Complaint:    Brittney [name] is a 64 year old female that was referred by Corbin Ade, PA with Dr. Maryjean Ka office for neuropsychological interventions.  The patient has had increasing concerns about changes in behavior and overall functioning along with concerns about her medication and looking for therapeutic interventions around both acute and long-term issues.  The patient had a concussive event in June 2018 where she fell backwards down the stairs.  The patient suffered nerve damage in her right ear that affects her trigeminal nerve/neuralgia and also has experienced vertigo and balance disturbance.  The patient describes difficulties making decisions and getting caught in a "tunnel" and losing track of time and focus.  The patient has been isolating herself from others and experiencing increasing panic attacks.  The patient also describes increasing depression, anxiety, chronic pain and being consumed by anger and hatred.  The patient has a past medical history that includes depression, GERD, right trigeminal neuralgia, right CTS release, chronic pain, attentional deficits, hyperlipidemia,  migraines and residual postconcussive syndrome.  Reason for Service:  Brittney [name] is a 64 year old female that was referred by Corbin Ade, PA with Dr. Maryjean Ka office for neuropsychological interventions.  The patient has had increasing concerns about changes in behavior and overall functioning along with concerns about her medication and looking for therapeutic interventions around both acute and long-term issues.  The patient had a concussive event in June 2018 where she fell backwards down the stairs.  The patient suffered nerve damage in her right ear that affects her trigeminal nerve/neuralgia and also has experienced vertigo and balance disturbance.  The patient describes difficulties making decisions and getting caught in a "tunnel" and losing track of time and focus.  The patient has been isolating herself from others and experiencing increasing panic attacks.  The patient also describes increasing depression, anxiety, chronic pain and being consumed by anger and hatred.  The patient has a past medical history that includes depression, GERD, right trigeminal neuralgia, right CTS release, chronic pain, attentional deficits, hyperlipidemia, migraines and residual postconcussive syndrome.  In her fall that occurred in June 2018 she hit her head on concrete suffering no laceration or loss of consciousness.  The patient was struggled for the next 2 days until seeing her PCP because of dizziness and trouble concentrating.  Dizziness was provoked by head and eye movements.  Along with the symptoms the patient also developed tinnitus in the right ear.  The patient was referred for vestibular therapy which helped with her vertigo.  The patient reports that she has lost confidence in herself after her fall and  she traditionally had always been confident in her abilities and skills and was always quite athletic throughout her life.  Now the patient is fearful of driving, difficulty doing normal activities  and isolating herself from others.  She describes significant sleep disturbance that is aided by Ambien.  Patient has limited her range of foods that she eats and tends to eat 1 or 2 foods and nothing else over and over again.  The patient describes ongoing memory difficulties.  The patient also reports that she had a change in her sense of smell and taste after the concussion.  Her difficulties are impacting her family and everything in her life appears to be getting harder.  The patient had an MRI conducted on 04/07/2019 with the resulting impressions of normal brain MRI for age.  There were no findings that were explanatory of the patient's identified symptoms.  There was no indication of acute process and no indications of his significant microvascular ischemic changes or atrophy.  Reliability of Information: Information is derived from a 1 hour 50-minute clinical interview as well as review of available medical records.  Behavioral Observation: Brittney [name]  presents as a 64 y.o.-year-old Right Caucasian Female who appeared her stated age. her dress was Appropriate and she was Well Groomed and her manners were Appropriate to the situation.  her participation was indicative of Appropriate and Redirectable behaviors.  There were not any physical disabilities noted.  she displayed an appropriate level of cooperation and motivation.     Interactions:    Active Appropriate and Redirectable  Attention:   abnormal and attention span appeared shorter than expected for age  Memory:   within normal limits; recent and remote memory intact  Visuo-spatial:  not examined  Speech (Volume):  normal  Speech:   normal; normal  Thought Process:  Coherent and Relevant  Though Content:  WNL; not suicidal and not homicidal  Orientation:   person, place, time/date and situation  Judgment:   Good  Planning:   Fair  Affect:    Anxious and  Depressed  Mood:    Dysphoric  Insight:   Good  Intelligence:   high  Marital Status/Living: The patient was born and raised in [place] with 3 older siblings.  Her childhood was marked by very critical parents as well as older siblings that were unkind to her and always hypercritical of her.  The patient is living with her significant other and they have been together for the past 23-1/2 years.  She and her wife have been married legally for the past 6 years but they had a union ceremony in 1998 and have been together this entire time.  The patient has no children.  Current Employment: The patient is not currently working.  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  Hobbies and interests have included birding, photography, gardening and specializing in native plants, geology, and studying climate change.  Substance Use:  No concerns of substance abuse are reported.    Education:   The patient graduated from [employer] and played on the golf team as well.  She reports that she did very well in school and relatively speaking her hardest subjects are math and chemistry.  She attended [employer] state for her freshman year of college before transferring to [employer].  Extracurricular activities included participating in the band, playing on the basketball team and the choir.  Medical History:   Past Medical History:  Diagnosis Date  . Anxiety   . Arthritis    knees and hands  . Depression   . GERD (gastroesophageal reflux disease)   . Headache(784.0)   . Trigeminal neuralgia         Abuse/Trauma History: While the patient denies any significant acute stressful event she had long-term and prolonged trauma and stress from relationships in childhood with her parents  and older siblings.  Psychiatric History:  The patient has no significant prior psychiatric history but has dealt with depression and anxiety symptoms for some time.  The patient currently takes Ambien at night and has taken Remeron at night for sleep as well as Wellbutrin and Lexapro for issues of depression.  Family Med/Psych History:  Family History  Problem Relation Age of Onset  . COPD Mother        Deceased, 52  . Heart disease Father        Deceased, 76  . Diabetes Brother   . Stroke Brother    Impression/DX:  Brittney [name] is a 64 year old female that was referred by Corbin Ade, PA with Dr. Maryjean Ka office for neuropsychological interventions.  The patient has had increasing concerns about changes in behavior and overall functioning along with concerns about her medication and looking for therapeutic interventions around both acute and long-term issues.  The patient had a concussive event in June 2018 where she fell backwards down the stairs.  The patient suffered nerve damage in her right ear that affects her trigeminal nerve/neuralgia and also has experienced vertigo and balance disturbance.  The patient describes difficulties making decisions and getting caught in a "tunnel" and losing track of time and focus.  The patient has been isolating herself from others and experiencing increasing panic attacks.  The patient also describes increasing depression, anxiety, chronic pain and being consumed by anger and hatred.  The patient has a past medical history that includes depression, GERD, right trigeminal neuralgia, right CTS release, chronic pain, attentional deficits, hyperlipidemia, migraines and residual postconcussive syndrome.  Disposition/Plan:  The patient has a longstanding history of stressful experiences and less than completely effective coping strategies but she is always managed to do quite well in life overall.  After her concussive event in 2018 there were significant  changes in her status and she has had residual postconcussive symptoms.  We have set the patient up for therapeutic interventions as I feel that that is the most important avenue to take versus a need for current formal neuropsychological testing.  We have set the patient up for 4 appointments approximately 2 weeks apart to get this process started.  Diagnosis:    Postconcussion syndrome  Benign paroxysmal positional vertigo of right ear  Chronic pain syndrome  Moderate episode of recurrent major depressive disorder ([place])         Electronically Signed   _______________________ Ilean Skill, Psy.D.

## 2020-07-29 ENCOUNTER — Other Ambulatory Visit: Payer: Self-pay

## 2020-07-29 ENCOUNTER — Encounter: Payer: Self-pay | Admitting: Psychology

## 2020-07-29 ENCOUNTER — Encounter: Payer: No Typology Code available for payment source | Attending: Psychology | Admitting: Psychology

## 2020-07-29 DIAGNOSIS — H8111 Benign paroxysmal vertigo, right ear: Secondary | ICD-10-CM

## 2020-07-29 DIAGNOSIS — F0781 Postconcussional syndrome: Secondary | ICD-10-CM

## 2020-07-29 DIAGNOSIS — G894 Chronic pain syndrome: Secondary | ICD-10-CM | POA: Diagnosis not present

## 2020-07-29 DIAGNOSIS — F331 Major depressive disorder, recurrent, moderate: Secondary | ICD-10-CM | POA: Diagnosis not present

## 2020-07-29 NOTE — Progress Notes (Signed)
Neuropsychology Visit  Patient:  Brittney [name]   DOB: June 24, 1956  MR Number: [id number]  Location: [facility] [place], [place] [id number] [place] [id number] Dept: [phone]  Date of Service: 07/29/2020  Start: 8 AM End: 9 AM  Duration of Service: 1 Hour  Today's visit was an in person visit that was conducted in my outpatient clinic office.  The patient myself were present for this visit.  Provider/Observer:     Edgardo Roys PsyD  Chief Complaint:      Chief Complaint  Patient presents with   Depression   Pain   Panic Attack   Other    Balance disturbance    Reason For Service:     GAELYN [name] is a 64 year old female that was referred by Corbin Ade, PA with Dr. Maryjean Ka office for neuropsychological interventions.  The patient has had increasing concerns about changes in behavior and overall functioning along with concerns about her medication and looking for therapeutic interventions around both acute and long-term issues.  The patient had a concussive event in June 2018 where she fell backwards down the stairs.  The patient suffered nerve damage in her right ear that affects her trigeminal nerve/neuralgia and also has experienced vertigo and balance disturbance.  The patient describes difficulties making decisions and getting caught in a "tunnel" and losing track of time and focus.  The patient has been isolating herself from others and experiencing increasing panic attacks.  The patient also describes increasing depression, anxiety, chronic pain and being consumed by anger and hatred.  The patient has a past medical history that includes depression, GERD, right trigeminal neuralgia, right CTS release, chronic pain, attentional deficits, hyperlipidemia, migraines and residual postconcussive syndrome.  In her fall that occurred in June 2018 she  hit her head on concrete suffering no laceration or loss of consciousness.  The patient was struggled for the next 2 days until seeing her PCP because of dizziness and trouble concentrating.  Dizziness was provoked by head and eye movements.  Along with the symptoms the patient also developed tinnitus in the right ear.  The patient was referred for vestibular therapy which helped with her vertigo.  The patient reports that she has lost confidence in herself after her fall and she traditionally had always been confident in her abilities and skills and was always quite athletic throughout her life.  Now the patient is fearful of driving, difficulty doing normal activities and isolating herself from others.  She describes significant sleep disturbance that is aided by Ambien.  Patient has limited her range of foods that she eats and tends to eat 1 or 2 foods and nothing else over and over again.  The patient describes ongoing memory difficulties.  The patient also reports that she had a change in her sense of smell and taste after the concussion.  Her difficulties are impacting her family and everything in her life appears to be getting harder.  The patient had an MRI conducted on 04/07/2019 with the resulting impressions of normal brain MRI for age.  There were no findings that were explanatory of the patient's identified symptoms.  There was no indication of acute process and no indications of his significant microvascular ischemic changes or atrophy.  Treatment Interventions:  Cognitive/behavioral psychotherapeutic interventions and working on specific coping skills and strategies and other behavioral and structural changes to improve adaptation and coping.  Participation  Level:   Active  Participation Quality:  Appropriate and Attentive      Behavioral Observation:  Well Groomed, Alert, and Appropriate.   Current Psychosocial Factors: The patient reports that she is still having issues with agitation  and frustration but most of these frustrations are directed towards her self due to ongoing memory issues and residual effects of her postconcussion syndrome including attention and concentration/memory changes.  The patient is also struggling with some of the potential side effect types of issues with her medications.  Content of Session:   Today we reviewed her current symptoms and continue to work on therapeutic interventions.  The patient has been working on some of the foundational issues including improving sleep hygiene, looking at her diet and nutritional patterns, beginning to reengage with increased sustained physical movement and working on increasing involvement with social interactions.  The patient reports that she continues to become hyper focused on issues and we discussed the potential connection between her taking Vyvanse for both early morning somnolence and attentional issues and the potential involvement this medication may have on her hyper fixation and rumination.  The patient has had times of severe even life-threatening depressive events that responded very well to her Remeron medication but the Remeron affected her appetite to the point of binge eating which was mediated by the Vyvanse.  As result of this polypharmacy we are looking at ways to assess which benefits and potential side effects are causing negative components to develop.  Effectiveness of Interventions: The patient has been very open and frank in our discussions and rapport has been easily established.  The patient is well motivated and has been actively working on the initial therapeutic interventions we have been developing.  Target Goals:   Target goals include managing and working through some of the residual effects of her postconcussion syndrome in the setting of more longstanding issues such as her major depressive events that have been severe in the past but are being effectively managed at this point through  utilizing well learned coping skills and strategies as well as psychotropic medications.  Goals Last Reviewed:   07/29/2020  Goals Addressed Today:    Today we worked on continuing adapting the foundational coping skills related to dietary patterns, sustained physical activity and working on improving sleep hygiene issues as well as looking at interaction between symptoms and current medication regimens.  Impression/Diagnosis:    Brittney [name] is a 65 year old female that was referred by Corbin Ade, PA with Dr. Maryjean Ka office for neuropsychological interventions.  The patient has had increasing concerns about changes in behavior and overall functioning along with concerns about her medication and looking for therapeutic interventions around both acute and long-term issues.  The patient had a concussive event in June 2018 where she fell backwards down the stairs.  The patient suffered nerve damage in her right ear that affects her trigeminal nerve/neuralgia and also has experienced vertigo and balance disturbance.  The patient describes difficulties making decisions and getting caught in a "tunnel" and losing track of time and focus.  The patient has been isolating herself from others and experiencing increasing panic attacks.  The patient also describes increasing depression, anxiety, chronic pain and being consumed by anger and hatred.  The patient has a past medical history that includes depression, GERD, right trigeminal neuralgia, right CTS release, chronic pain, attentional deficits, hyperlipidemia, migraines and residual postconcussive syndrome.  Diagnosis:   Postconcussion syndrome  Benign paroxysmal positional vertigo of right ear  Chronic pain syndrome  Moderate episode of recurrent major depressive disorder ([facility])    Ilean Skill, Psy.D. Clinical Psychologist Neuropsychologist

## 2020-08-05 ENCOUNTER — Ambulatory Visit: Payer: No Typology Code available for payment source | Admitting: Psychology

## 2020-08-12 ENCOUNTER — Encounter: Payer: No Typology Code available for payment source | Attending: Psychology | Admitting: Psychology

## 2020-08-12 ENCOUNTER — Other Ambulatory Visit: Payer: Self-pay

## 2020-08-12 DIAGNOSIS — F331 Major depressive disorder, recurrent, moderate: Secondary | ICD-10-CM | POA: Insufficient documentation

## 2020-08-12 DIAGNOSIS — F0781 Postconcussional syndrome: Secondary | ICD-10-CM

## 2020-08-12 DIAGNOSIS — G894 Chronic pain syndrome: Secondary | ICD-10-CM | POA: Insufficient documentation

## 2020-08-12 DIAGNOSIS — F320 Major depressive disorder, single episode, mild: Secondary | ICD-10-CM | POA: Insufficient documentation

## 2020-08-12 DIAGNOSIS — H8111 Benign paroxysmal vertigo, right ear: Secondary | ICD-10-CM | POA: Diagnosis not present

## 2020-08-18 ENCOUNTER — Encounter: Payer: Self-pay | Admitting: Psychology

## 2020-08-18 NOTE — Progress Notes (Signed)
Neuropsychology Visit  Patient:  Brittney [name]   DOB: 04/24/1956  MR Number: [id number]  Location: [facility] [place], [place] [id number] [place] [id number] Dept: [phone]  Date of Service: 08/12/2020  Start: 3 PM End: 4 PM  Duration of Service: 1 Hour  Today's visit was an in person visit that was conducted in my outpatient clinic office.  The patient myself were present for this visit.  Provider/Observer:     Edgardo Roys PsyD  Chief Complaint:      Chief Complaint  Patient presents with  . Depression  . Pain  . Panic Attack  . Other    Gait disturbance    Reason For Service:     Brittney [name] is a 64 year old female that was referred by Corbin Ade, PA with Dr. Maryjean Ka office for neuropsychological interventions.  The patient has had increasing concerns about changes in behavior and overall functioning along with concerns about her medication and looking for therapeutic interventions around both acute and long-term issues.  The patient had a concussive event in June 2018 where she fell backwards down the stairs.  The patient suffered nerve damage in her right ear that affects her trigeminal nerve/neuralgia and also has experienced vertigo and balance disturbance.  The patient describes difficulties making decisions and getting caught in a "tunnel" and losing track of time and focus.  The patient has been isolating herself from others and experiencing increasing panic attacks.  The patient also describes increasing depression, anxiety, chronic pain and being consumed by anger and hatred.  The patient has a past medical history that includes depression, GERD, right trigeminal neuralgia, right CTS release, chronic pain, attentional deficits, hyperlipidemia, migraines and residual postconcussive syndrome.  In her fall that occurred in June 2018 she hit  her head on concrete suffering no laceration or loss of consciousness.  The patient was struggled for the next 2 days until seeing her PCP because of dizziness and trouble concentrating.  Dizziness was provoked by head and eye movements.  Along with the symptoms the patient also developed tinnitus in the right ear.  The patient was referred for vestibular therapy which helped with her vertigo.  The patient reports that she has lost confidence in herself after her fall and she traditionally had always been confident in her abilities and skills and was always quite athletic throughout her life.  Now the patient is fearful of driving, difficulty doing normal activities and isolating herself from others.  She describes significant sleep disturbance that is aided by Ambien.  Patient has limited her range of foods that she eats and tends to eat 1 or 2 foods and nothing else over and over again.  The patient describes ongoing memory difficulties.  The patient also reports that she had a change in her sense of smell and taste after the concussion.  Her difficulties are impacting her family and everything in her life appears to be getting harder.  The patient had an MRI conducted on 04/07/2019 with the resulting impressions of normal brain MRI for age.  There were no findings that were explanatory of the patient's identified symptoms.  There was no indication of acute process and no indications of his significant microvascular ischemic changes or atrophy.  Treatment Interventions:  Cognitive/behavioral psychotherapeutic interventions and working on specific coping skills and strategies and other behavioral and structural changes to improve adaptation and coping.  Participation  Level:   Active  Participation Quality:  Appropriate and Attentive      Behavioral Observation:  Well Groomed, Alert, and Appropriate.   Current Psychosocial Factors: The patient reports that she is still having issues with agitation and  frustration but most of these frustrations are directed towards her self due to ongoing memory issues and residual effects of her postconcussion syndrome including attention and concentration/memory changes.  The patient is also struggling with some of the potential side effect types of issues with her medications.  Content of Session:   Today we reviewed her current symptoms and continue to work on therapeutic interventions.  The patient has been working on some of the foundational issues including improving sleep hygiene, looking at her diet and nutritional patterns, beginning to reengage with increased sustained physical movement and working on increasing involvement with social interactions.  The patient reports that she continues to become hyper focused on issues and we discussed the potential connection between her taking Vyvanse for both early morning somnolence and attentional issues and the potential involvement this medication may have on her hyper fixation and rumination.  The patient has had times of severe even life-threatening depressive events that responded very well to her Remeron medication but the Remeron affected her appetite to the point of binge eating which was mediated by the Vyvanse.  As result of this polypharmacy we are looking at ways to assess which benefits and potential side effects are causing negative components to develop.  Effectiveness of Interventions: The patient has been very open and frank in our discussions and rapport has been easily established.  The patient is well motivated and has been actively working on the initial therapeutic interventions we have been developing.  Target Goals:   Target goals include managing and working through some of the residual effects of her postconcussion syndrome in the setting of more longstanding issues such as her major depressive events that have been severe in the past but are being effectively managed at this point through  utilizing well learned coping skills and strategies as well as psychotropic medications.  Goals Last Reviewed:   08/12/2020  Goals Addressed Today:    Today we worked on continuing adapting the foundational coping skills related to dietary patterns, sustained physical activity and working on improving sleep hygiene issues as well as looking at interaction between symptoms and current medication regimens.  Impression/Diagnosis:    Brittney [name] is a 64 year old female that was referred by Corbin Ade, PA with Dr. Maryjean Ka office for neuropsychological interventions.  The patient has had increasing concerns about changes in behavior and overall functioning along with concerns about her medication and looking for therapeutic interventions around both acute and long-term issues.  The patient had a concussive event in June 2018 where she fell backwards down the stairs.  The patient suffered nerve damage in her right ear that affects her trigeminal nerve/neuralgia and also has experienced vertigo and balance disturbance.  The patient describes difficulties making decisions and getting caught in a "tunnel" and losing track of time and focus.  The patient has been isolating herself from others and experiencing increasing panic attacks.  The patient also describes increasing depression, anxiety, chronic pain and being consumed by anger and hatred.  The patient has a past medical history that includes depression, GERD, right trigeminal neuralgia, right CTS release, chronic pain, attentional deficits, hyperlipidemia, migraines and residual postconcussive syndrome.  Diagnosis:   Postconcussion syndrome  Benign paroxysmal positional vertigo of right ear  Chronic pain syndrome  Moderate episode of recurrent major depressive disorder (HCC)    Ilean Skill, Psy.D. Clinical Psychologist Neuropsychologist

## 2020-08-26 ENCOUNTER — Other Ambulatory Visit: Payer: Self-pay

## 2020-08-26 ENCOUNTER — Encounter: Payer: Self-pay | Admitting: Psychology

## 2020-08-26 ENCOUNTER — Encounter: Payer: No Typology Code available for payment source | Admitting: Psychology

## 2020-08-26 DIAGNOSIS — F331 Major depressive disorder, recurrent, moderate: Secondary | ICD-10-CM | POA: Diagnosis not present

## 2020-08-26 DIAGNOSIS — G894 Chronic pain syndrome: Secondary | ICD-10-CM

## 2020-08-26 DIAGNOSIS — H8111 Benign paroxysmal vertigo, right ear: Secondary | ICD-10-CM | POA: Diagnosis not present

## 2020-08-26 DIAGNOSIS — F0781 Postconcussional syndrome: Secondary | ICD-10-CM | POA: Diagnosis not present

## 2020-08-26 DIAGNOSIS — F320 Major depressive disorder, single episode, mild: Secondary | ICD-10-CM | POA: Diagnosis not present

## 2020-08-26 NOTE — Progress Notes (Signed)
Neuropsychology Visit  Patient:  Brittney [name]   DOB: [birth year]  MR Number: [id number]  Location: [facility] [place], [place] [id number] [place] [address] Dept: [phone]  Date of Service: 08/26/2020  Start: 8 AM End: 9 AM  Duration of Service: 1 Hour  Today's visit was an in person visit that was conducted in my outpatient clinic office.  The patient myself were present for this visit.  Provider/Observer:     Edgardo Roys PsyD  Chief Complaint:      Chief Complaint  Patient presents with  . Depression  . Pain  . Panic Attack  . Other    Gait disturbance    Reason For Service:     Brittney [name] is a 64 year old female that was referred by Corbin Ade, PA with Dr. Maryjean Ka office for neuropsychological interventions.  The patient has had increasing concerns about changes in behavior and overall functioning along with concerns about her medication and looking for therapeutic interventions around both acute and long-term issues.  The patient had a concussive event in June 2018 where she fell backwards down the stairs.  The patient suffered nerve damage in her right ear that affects her trigeminal nerve/neuralgia and also has experienced vertigo and balance disturbance.  The patient describes difficulties making decisions and getting caught in a "tunnel" and losing track of time and focus.  The patient has been isolating herself from others and experiencing increasing panic attacks.  The patient also describes increasing depression, anxiety, chronic pain and being consumed by anger and hatred.  The patient has a past medical history that includes depression, GERD, right trigeminal neuralgia, right CTS release, chronic pain, attentional deficits, hyperlipidemia, migraines and residual postconcussive syndrome.  In her fall that occurred in June 2018 she hit  her head on concrete suffering no laceration or loss of consciousness.  The patient was struggled for the next 2 days until seeing her PCP because of dizziness and trouble concentrating.  Dizziness was provoked by head and eye movements.  Along with the symptoms the patient also developed tinnitus in the right ear.  The patient was referred for vestibular therapy which helped with her vertigo.  The patient reports that she has lost confidence in herself after her fall and she traditionally had always been confident in her abilities and skills and was always quite athletic throughout her life.  Now the patient is fearful of driving, difficulty doing normal activities and isolating herself from others.  She describes significant sleep disturbance that is aided by Ambien.  Patient has limited her range of foods that she eats and tends to eat 1 or 2 foods and nothing else over and over again.  The patient describes ongoing memory difficulties.  The patient also reports that she had a change in her sense of smell and taste after the concussion.  Her difficulties are impacting her family and everything in her life appears to be getting harder.  The patient had an MRI conducted on 04/07/2019 with the resulting impressions of normal brain MRI for age.  There were no findings that were explanatory of the patient's identified symptoms.  There was no indication of acute process and no indications of his significant microvascular ischemic changes or atrophy.  Treatment Interventions:  Cognitive/behavioral psychotherapeutic interventions and working on specific coping skills and strategies and other behavioral and structural changes to improve adaptation and coping.  Participation  Level:   Active  Participation Quality:  Appropriate and Attentive      Behavioral Observation:  Well Groomed, Alert, and Appropriate.   Current Psychosocial Factors: The patient continues to describe and report issues of agitation and  frustration and long-term anger towards siblings.  She reports that her ongoing memory issues have improved but she continues to have lingering effects of her postconcussive syndrome including attention and concentration/memory changes.  The patient reports that she continues to isolate herself and continues to struggle with frustration and anger issues particular around her older siblings.  Content of Session:   Today we reviewed her current symptoms and continue to work on therapeutic interventions.  The patient has been working on some of the foundational issues including improving sleep hygiene, looking at her diet and nutritional patterns, beginning to reengage with increased sustained physical movement and working on increasing involvement with social interactions.  The patient reports that she continues to become hyper focused on issues and we discussed the potential connection between her taking Vyvanse for both early morning somnolence and attentional issues and the potential involvement this medication may have on her hyper fixation and rumination.  The patient has had times of severe even life-threatening depressive events that responded very well to her Remeron medication but the Remeron affected her appetite to the point of binge eating which was mediated by the Vyvanse.  As result of this polypharmacy we are looking at ways to assess which benefits and potential side effects are causing negative components to develop.  Effectiveness of Interventions: The patient has been very open and frank in our discussions and rapport has been easily established.  The patient is well motivated and has been actively working on the initial therapeutic interventions we have been developing.  Target Goals:   Target goals include managing and working through some of the residual effects of her postconcussion syndrome in the setting of more longstanding issues such as her major depressive events that have been  severe in the past but are being effectively managed at this point through utilizing well learned coping skills and strategies as well as psychotropic medications.  Goals Last Reviewed:   08/26/2020  Goals Addressed Today:    One of the primary focuses of today's therapeutic intervention worked longstanding conflicts and stressors going back to childhood regarding relationships with her parents and siblings.  The patient is continuing to struggle with how to cope and adapt/adjust to childhood and young adult issues that continue to have a deleterious effect on her today.  Working on stressful triggers are important as far as managing and recovering from her postconcussional syndrome symptoms.  Impression/Diagnosis:    JAKLYN ALEN is a 64 year old female that was referred by Corbin Ade, PA with Dr. Maryjean Ka office for neuropsychological interventions.  The patient has had increasing concerns about changes in behavior and overall functioning along with concerns about her medication and looking for therapeutic interventions around both acute and long-term issues.  The patient had a concussive event in June 2018 where she fell backwards down the stairs.  The patient suffered nerve damage in her right ear that affects her trigeminal nerve/neuralgia and also has experienced vertigo and balance disturbance.  The patient describes difficulties making decisions and getting caught in a "tunnel" and losing track of time and focus.  The patient has been isolating herself from others and experiencing increasing panic attacks.  The patient also describes increasing depression, anxiety, chronic pain and being consumed by anger and  hatred.  The patient has a past medical history that includes depression, GERD, right trigeminal neuralgia, right CTS release, chronic pain, attentional deficits, hyperlipidemia, migraines and residual postconcussive syndrome.  Diagnosis:   Postconcussion syndrome  Chronic pain  syndrome  Benign paroxysmal positional vertigo of right ear  Moderate episode of recurrent major depressive disorder (HCC)  Current mild episode of major depressive disorder, unspecified whether recurrent ([place])    Ilean Skill, Psy.D. Clinical Psychologist Neuropsychologist

## 2020-09-09 ENCOUNTER — Encounter: Payer: No Typology Code available for payment source | Attending: Psychology | Admitting: Psychology

## 2020-09-09 ENCOUNTER — Other Ambulatory Visit: Payer: Self-pay

## 2020-09-09 DIAGNOSIS — F0781 Postconcussional syndrome: Secondary | ICD-10-CM

## 2020-09-09 DIAGNOSIS — F331 Major depressive disorder, recurrent, moderate: Secondary | ICD-10-CM | POA: Diagnosis not present

## 2020-09-09 DIAGNOSIS — H8111 Benign paroxysmal vertigo, right ear: Secondary | ICD-10-CM | POA: Diagnosis not present

## 2020-09-09 DIAGNOSIS — G894 Chronic pain syndrome: Secondary | ICD-10-CM | POA: Insufficient documentation

## 2020-09-10 ENCOUNTER — Encounter: Payer: Self-pay | Admitting: Psychology

## 2020-09-10 NOTE — Progress Notes (Signed)
Neuropsychology Visit  Patient:  Brittney [name]   DOB: 1956/06/23  MR Number: [id number]  Location: [facility] [place], [place] [id number] [place] [address] Dept: [phone]  Date of Service: 09/09/2020  Start: 8 AM End: 9 AM  Duration of Service: 1 Hour  Today's visit was an in person visit was conducted in my outpatient clinic office.  The patient and myself were present for this visit.  Provider/Observer:     Edgardo Roys PsyD  Chief Complaint:      Chief Complaint  Patient presents with  . Depression  . Pain  . Panic Attack  . Other    Reason For Service:     Brittney [name] is a 64 year old female that was referred by Corbin Ade, PA with Dr. Maryjean Ka office for neuropsychological interventions.  The patient has had increasing concerns about changes in behavior and overall functioning along with concerns about her medication and looking for therapeutic interventions around both acute and long-term issues.  The patient had a concussive event in June 2018 where she fell backwards down the stairs.  The patient suffered nerve damage in her right ear that affects her trigeminal nerve/neuralgia and also has experienced vertigo and balance disturbance.  The patient describes difficulties making decisions and getting caught in a "tunnel" and losing track of time and focus.  The patient has been isolating herself from others and experiencing increasing panic attacks.  The patient also describes increasing depression, anxiety, chronic pain and being consumed by anger and hatred.  The patient has a past medical history that includes depression, GERD, right trigeminal neuralgia, right CTS release, chronic pain, attentional deficits, hyperlipidemia, migraines and residual postconcussive syndrome.  In her fall that occurred in June 2018 she hit her head on concrete  suffering no laceration or loss of consciousness.  The patient was struggled for the next 2 days until seeing her PCP because of dizziness and trouble concentrating.  Dizziness was provoked by head and eye movements.  Along with the symptoms the patient also developed tinnitus in the right ear.  The patient was referred for vestibular therapy which helped with her vertigo.  The patient reports that she has lost confidence in herself after her fall and she traditionally had always been confident in her abilities and skills and was always quite athletic throughout her life.  Now the patient is fearful of driving, difficulty doing normal activities and isolating herself from others.  She describes significant sleep disturbance that is aided by Ambien.  Patient has limited her range of foods that she eats and tends to eat 1 or 2 foods and nothing else over and over again.  The patient describes ongoing memory difficulties.  The patient also reports that she had a change in her sense of smell and taste after the concussion.  Her difficulties are impacting her family and everything in her life appears to be getting harder.  The patient had an MRI conducted on 04/07/2019 with the resulting impressions of normal brain MRI for age.  There were no findings that were explanatory of the patient's identified symptoms.  There was no indication of acute process and no indications of his significant microvascular ischemic changes or atrophy.  The patient continues to struggle with chronic pain issues and gait disturbance.  Depressive episode and issues dealing with significant psychosocial also remain problematic.  Treatment Interventions:  Cognitive/behavioral psychotherapeutic interventions and  working on specific coping skills and strategies and other behavioral and structural changes to improve adaptation and coping.  Participation Level:   Active  Participation Quality:  Appropriate and Attentive      Behavioral  Observation:  Well Groomed, Alert, and Appropriate.   Current Psychosocial Factors: The patient reports that she continues to have little or no interaction with her siblings and of greater stresses the lack of interaction with her niece and nephews.  She has been struggling with whether or not to contact one of her nephews who she had a close relationship prior to a major strain with her sister and the nephew's mother.  Content of Session:   Today we continue to work on issues related to major stressors that exacerbate her underlying chronic pain symptoms and panic attacks.  Effectiveness of Interventions: The patient has been very open and frank in our discussions and rapport has been easily established.  The patient is well motivated and has been actively working on the initial therapeutic interventions we have been developing.  Target Goals:   Target goals include managing and working through some of the residual effects of her postconcussion syndrome in the setting of more longstanding issues such as her major depressive events that have been severe in the past but are being effectively managed at this point through utilizing well learned coping skills and strategies as well as psychotropic medications.  Goals Last Reviewed:   09/09/2020  Goals Addressed Today:    One of the primary focuses of today's therapeutic intervention worked longstanding conflicts and stressors going back to childhood regarding relationships with her parents and siblings.  The patient is continuing to struggle with how to cope and adapt/adjust to childhood and young adult issues that continue to have a deleterious effect on her today.  Working on stressful triggers are important as far as managing and recovering from her postconcussional syndrome symptoms.  Impression/Diagnosis:    Brittney [name] is a 64 year old female that was referred by Corbin Ade, PA with Dr. Maryjean Ka office for neuropsychological interventions.   The patient has had increasing concerns about changes in behavior and overall functioning along with concerns about her medication and looking for therapeutic interventions around both acute and long-term issues.  The patient had a concussive event in June 2018 where she fell backwards down the stairs.  The patient suffered nerve damage in her right ear that affects her trigeminal nerve/neuralgia and also has experienced vertigo and balance disturbance.  The patient describes difficulties making decisions and getting caught in a "tunnel" and losing track of time and focus.  The patient has been isolating herself from others and experiencing increasing panic attacks.  The patient also describes increasing depression, anxiety, chronic pain and being consumed by anger and hatred.  The patient has a past medical history that includes depression, GERD, right trigeminal neuralgia, right CTS release, chronic pain, attentional deficits, hyperlipidemia, migraines and residual postconcussive syndrome.  Diagnosis:   Postconcussion syndrome  Chronic pain syndrome  Moderate episode of recurrent major depressive disorder (HCC)    Ilean Skill, Psy.D. Clinical Psychologist Neuropsychologist

## 2020-11-04 ENCOUNTER — Encounter: Payer: No Typology Code available for payment source | Attending: Psychology | Admitting: Psychology

## 2020-11-04 ENCOUNTER — Other Ambulatory Visit: Payer: Self-pay

## 2020-11-04 DIAGNOSIS — G894 Chronic pain syndrome: Secondary | ICD-10-CM | POA: Insufficient documentation

## 2020-11-04 DIAGNOSIS — F331 Major depressive disorder, recurrent, moderate: Secondary | ICD-10-CM | POA: Diagnosis not present

## 2020-11-04 DIAGNOSIS — F0781 Postconcussional syndrome: Secondary | ICD-10-CM | POA: Insufficient documentation

## 2020-11-14 ENCOUNTER — Encounter: Payer: Self-pay | Admitting: Psychology

## 2020-11-14 NOTE — Progress Notes (Signed)
Neuropsychology Visit  Patient:  KYELLE URBAS   DOB: [birth year]  MR Number: [id number]  Location: [facility] [place], [place] [id number] [facility] [place] [id number] Dept: [phone]  Date of Service: 11/04/2020  Start: 8 AM End: 9 AM  Duration of Service: 1 Hour  Today's visit was an in person visit was conducted in my outpatient clinic office.  The patient and myself were present for this visit.  Provider/Observer:     Edgardo Roys PsyD  Chief Complaint:      Chief Complaint  Patient presents with  . Pain  . Depression  . Panic Attack  . Other    Reason For Service:     ARTESHA WEMHOFF is a 65 year old female that was referred by Corbin Ade, PA with Dr. Maryjean Ka office for neuropsychological interventions.  The patient has had increasing concerns about changes in behavior and overall functioning along with concerns about her medication and looking for therapeutic interventions around both acute and long-term issues.  The patient had a concussive event in June 2018 where she fell backwards down the stairs.  The patient suffered nerve damage in her right ear that affects her trigeminal nerve/neuralgia and also has experienced vertigo and balance disturbance.  The patient describes difficulties making decisions and getting caught in a "tunnel" and losing track of time and focus.  The patient has been isolating herself from others and experiencing increasing panic attacks.  The patient also describes increasing depression, anxiety, chronic pain and being consumed by anger and hatred.  The patient has a past medical history that includes depression, GERD, right trigeminal neuralgia, right CTS release, chronic pain, attentional deficits, hyperlipidemia, migraines and residual postconcussive syndrome.  In her fall that occurred in June 2018 she hit her head on concrete  suffering no laceration or loss of consciousness.  The patient was struggled for the next 2 days until seeing her PCP because of dizziness and trouble concentrating.  Dizziness was provoked by head and eye movements.  Along with the symptoms the patient also developed tinnitus in the right ear.  The patient was referred for vestibular therapy which helped with her vertigo.  The patient reports that she has lost confidence in herself after her fall and she traditionally had always been confident in her abilities and skills and was always quite athletic throughout her life.  Now the patient is fearful of driving, difficulty doing normal activities and isolating herself from others.  She describes significant sleep disturbance that is aided by Ambien.  Patient has limited her range of foods that she eats and tends to eat 1 or 2 foods and nothing else over and over again.  The patient describes ongoing memory difficulties.  The patient also reports that she had a change in her sense of smell and taste after the concussion.  Her difficulties are impacting her family and everything in her life appears to be getting harder.  The patient had an MRI conducted on 04/07/2019 with the resulting impressions of normal brain MRI for age.  There were no findings that were explanatory of the patient's identified symptoms.  There was no indication of acute process and no indications of his significant microvascular ischemic changes or atrophy.  The patient continues to struggle with chronic pain issues and gait disturbance.  Depressive episode and issues dealing with significant psychosocial also remain problematic.  Treatment Interventions:  Cognitive/behavioral psychotherapeutic interventions and  working on specific coping skills and strategies and other behavioral and structural changes to improve adaptation and coping.  Participation Level:   Active  Participation Quality:  Appropriate and Attentive      Behavioral  Observation:  Well Groomed, Alert, and Appropriate.   Current Psychosocial Factors: The patient reports that she continues to have little or no interaction with her siblings and of greater stresses the lack of interaction with her niece and nephews.  She has been struggling with whether or not to contact one of her nephews who she had a close relationship prior to a major strain with her sister and the nephew's mother.  Content of Session:   Today we continue to work on issues related to major stressors that exacerbate her underlying chronic pain symptoms and panic attacks.  Effectiveness of Interventions: The patient has been very open and frank in our discussions and rapport has been easily established.  The patient is well motivated and has been actively working on the initial therapeutic interventions we have been developing.  Target Goals:   Target goals include managing and working through some of the residual effects of her postconcussion syndrome in the setting of more longstanding issues such as her major depressive events that have been severe in the past but are being effectively managed at this point through utilizing well learned coping skills and strategies as well as psychotropic medications.  Goals Last Reviewed:   11/04/2020  Goals Addressed Today:    One of the primary focuses of today's therapeutic intervention worked longstanding conflicts and stressors going back to childhood regarding relationships with her parents and siblings.  The patient is continuing to struggle with how to cope and adapt/adjust to childhood and young adult issues that continue to have a deleterious effect on her today.  Working on stressful triggers are important as far as managing and recovering from her postconcussional syndrome symptoms.  Impression/Diagnosis:    LORRENA GORANSON is a 65 year old female that was referred by Corbin Ade, PA with Dr. Maryjean Ka office for neuropsychological interventions.   The patient has had increasing concerns about changes in behavior and overall functioning along with concerns about her medication and looking for therapeutic interventions around both acute and long-term issues.  The patient had a concussive event in June 2018 where she fell backwards down the stairs.  The patient suffered nerve damage in her right ear that affects her trigeminal nerve/neuralgia and also has experienced vertigo and balance disturbance.  The patient describes difficulties making decisions and getting caught in a "tunnel" and losing track of time and focus.  The patient has been isolating herself from others and experiencing increasing panic attacks.  The patient also describes increasing depression, anxiety, chronic pain and being consumed by anger and hatred.  The patient has a past medical history that includes depression, GERD, right trigeminal neuralgia, right CTS release, chronic pain, attentional deficits, hyperlipidemia, migraines and residual postconcussive syndrome.  The patient is continued to have difficulties particular around depressive symptoms and residual effects of her postconcussion syndrome.  We will continue to work on therapeutic interventions around coping and adjustment issues and managing her depression and panic events.  Anxiety and panic continue to be problematic and the patient is very socially isolated.  Diagnosis:   Postconcussion syndrome  Chronic pain syndrome  Moderate episode of recurrent major depressive disorder (HCC)    Ilean Skill, Psy.D. Clinical Psychologist Neuropsychologist

## 2020-11-25 ENCOUNTER — Other Ambulatory Visit: Payer: Self-pay

## 2020-11-25 ENCOUNTER — Encounter: Payer: No Typology Code available for payment source | Attending: Psychology | Admitting: Psychology

## 2020-11-25 DIAGNOSIS — F331 Major depressive disorder, recurrent, moderate: Secondary | ICD-10-CM | POA: Diagnosis not present

## 2020-11-25 DIAGNOSIS — F0781 Postconcussional syndrome: Secondary | ICD-10-CM

## 2020-11-25 DIAGNOSIS — G894 Chronic pain syndrome: Secondary | ICD-10-CM | POA: Diagnosis not present

## 2020-12-06 ENCOUNTER — Other Ambulatory Visit: Payer: Self-pay

## 2020-12-06 ENCOUNTER — Encounter: Payer: No Typology Code available for payment source | Admitting: Psychology

## 2020-12-06 DIAGNOSIS — F331 Major depressive disorder, recurrent, moderate: Secondary | ICD-10-CM | POA: Diagnosis not present

## 2020-12-06 DIAGNOSIS — G894 Chronic pain syndrome: Secondary | ICD-10-CM | POA: Diagnosis not present

## 2020-12-06 DIAGNOSIS — F0781 Postconcussional syndrome: Secondary | ICD-10-CM

## 2020-12-17 ENCOUNTER — Encounter: Payer: Self-pay | Admitting: Psychology

## 2020-12-17 NOTE — Progress Notes (Signed)
Neuropsychology Visit  Patient:  Brittney [name]   DOB: [birth year]  MR Number: [id number]  Location: [facility] [place], [place] [id number] [address] [place] [address] Dept: [phone]  Date of Service: 11/25/2020  Start: 10 AM End: 11 AM  Duration of Service: 1 Hour  Today's visit was an in person visit was conducted in my outpatient clinic office.  The patient and myself were present for this visit.  Provider/Observer:     Edgardo Roys PsyD  Chief Complaint:      Chief Complaint  Patient presents with  . Pain  . Depression  . Other    Reason For Service:     Brittney [name] is a 65 year old female that was referred by Corbin Ade, PA with Dr. Maryjean Ka office for neuropsychological interventions.  The patient has had increasing concerns about changes in behavior and overall functioning along with concerns about her medication and looking for therapeutic interventions around both acute and long-term issues.  The patient had a concussive event in June 2018 where she fell backwards down the stairs.  The patient suffered nerve damage in her right ear that affects her trigeminal nerve/neuralgia and also has experienced vertigo and balance disturbance.  The patient describes difficulties making decisions and getting caught in a "tunnel" and losing track of time and focus.  The patient has been isolating herself from others and experiencing increasing panic attacks.  The patient also describes increasing depression, anxiety, chronic pain and being consumed by anger and hatred.  The patient has a past medical history that includes depression, GERD, right trigeminal neuralgia, right CTS release, chronic pain, attentional deficits, hyperlipidemia, migraines and residual postconcussive syndrome.  In her fall that occurred in June 2018 she hit her head on concrete suffering no  laceration or loss of consciousness.  The patient was struggled for the next 2 days until seeing her PCP because of dizziness and trouble concentrating.  Dizziness was provoked by head and eye movements.  Along with the symptoms the patient also developed tinnitus in the right ear.  The patient was referred for vestibular therapy which helped with her vertigo.  The patient reports that she has lost confidence in herself after her fall and she traditionally had always been confident in her abilities and skills and was always quite athletic throughout her life.  Now the patient is fearful of driving, difficulty doing normal activities and isolating herself from others.  She describes significant sleep disturbance that is aided by Ambien.  Patient has limited her range of foods that she eats and tends to eat 1 or 2 foods and nothing else over and over again.  The patient describes ongoing memory difficulties.  The patient also reports that she had a change in her sense of smell and taste after the concussion.  Her difficulties are impacting her family and everything in her life appears to be getting harder.  The patient had an MRI conducted on 04/07/2019 with the resulting impressions of normal brain MRI for age.  There were no findings that were explanatory of the patient's identified symptoms.  There was no indication of acute process and no indications of his significant microvascular ischemic changes or atrophy.  The patient continues to struggle with chronic pain issues and gait disturbance.  Depressive episode and issues dealing with significant psychosocial also remain problematic.  Treatment Interventions:  Cognitive/behavioral psychotherapeutic interventions and working on specific coping  skills and strategies and other behavioral and structural changes to improve adaptation and coping.  Participation Level:   Active  Participation Quality:  Appropriate and Attentive      Behavioral  Observation:  Well Groomed, Alert, and Appropriate.   Current Psychosocial Factors: The patient reports that she continues to have little or no interaction with her siblings and of greater stresses the lack of interaction with her niece and nephews.  She has been struggling with whether or not to contact one of her nephews who she had a close relationship prior to a major strain with her sister and the nephew's mother.  Content of Session:   Today we continue to work on issues related to major stressors that exacerbate her underlying chronic pain symptoms and panic attacks.  Effectiveness of Interventions: The patient has been very open and frank in our discussions and rapport has been easily established.  The patient is well motivated and has been actively working on the initial therapeutic interventions we have been developing.  Target Goals:   Target goals include managing and working through some of the residual effects of her postconcussion syndrome in the setting of more longstanding issues such as her major depressive events that have been severe in the past but are being effectively managed at this point through utilizing well learned coping skills and strategies as well as psychotropic medications.  Goals Last Reviewed:   11/04/2020  Goals Addressed Today:    One of the primary focuses of today's therapeutic intervention worked longstanding conflicts and stressors going back to childhood regarding relationships with her parents and siblings.  The patient is continuing to struggle with how to cope and adapt/adjust to childhood and young adult issues that continue to have a deleterious effect on her today.  Working on stressful triggers are important as far as managing and recovering from her postconcussional syndrome symptoms.  Impression/Diagnosis:    Brittney [name] is a 65 year old female that was referred by Corbin Ade, PA with Dr. Maryjean Ka office for neuropsychological interventions.   The patient has had increasing concerns about changes in behavior and overall functioning along with concerns about her medication and looking for therapeutic interventions around both acute and long-term issues.  The patient had a concussive event in June 2018 where she fell backwards down the stairs.  The patient suffered nerve damage in her right ear that affects her trigeminal nerve/neuralgia and also has experienced vertigo and balance disturbance.  The patient describes difficulties making decisions and getting caught in a "tunnel" and losing track of time and focus.  The patient has been isolating herself from others and experiencing increasing panic attacks.  The patient also describes increasing depression, anxiety, chronic pain and being consumed by anger and hatred.  The patient has a past medical history that includes depression, GERD, right trigeminal neuralgia, right CTS release, chronic pain, attentional deficits, hyperlipidemia, migraines and residual postconcussive syndrome.  The patient is continued to have difficulties particular around depressive symptoms and residual effects of her postconcussion syndrome.  We will continue to work on therapeutic interventions around coping and adjustment issues and managing her depression and panic events.  Anxiety and panic continue to be problematic and the patient is very socially isolated.  Diagnosis:   Postconcussion syndrome  Chronic pain syndrome  Moderate episode of recurrent major depressive disorder (HCC)    Ilean Skill, Psy.D. Clinical Psychologist Neuropsychologist

## 2020-12-17 NOTE — Progress Notes (Signed)
Neuropsychology Visit  Patient:  Brittney [name]   DOB: [birth year]  MR Number: [id number]  Location: [facility] [place], [place] [id number] [facility] [place] [address] Dept: [phone]  Date of Service: 12/06/2020  Start: 3 PM  End: 4 PM  Duration of Service: 1 Hour  Today's visit was an in person visit was conducted in my outpatient clinic office.  The patient and myself were present for this visit.  Provider/Observer:     Edgardo Roys PsyD  Chief Complaint:      Chief Complaint  Patient presents with  . Pain  . Depression  . Other    Reason For Service:     Brittney [name] is a 65 year old female that was referred by Corbin Ade, PA with Dr. Maryjean Ka office for neuropsychological interventions.  The patient has had increasing concerns about changes in behavior and overall functioning along with concerns about her medication and looking for therapeutic interventions around both acute and long-term issues.  The patient had a concussive event in June 2018 where she fell backwards down the stairs.  The patient suffered nerve damage in her right ear that affects her trigeminal nerve/neuralgia and also has experienced vertigo and balance disturbance.  The patient describes difficulties making decisions and getting caught in a "tunnel" and losing track of time and focus.  The patient has been isolating herself from others and experiencing increasing panic attacks.  The patient also describes increasing depression, anxiety, chronic pain and being consumed by anger and hatred.  The patient has a past medical history that includes depression, GERD, right trigeminal neuralgia, right CTS release, chronic pain, attentional deficits, hyperlipidemia, migraines and residual postconcussive syndrome.  In her fall that occurred in June 2018 she hit her head on concrete suffering no  laceration or loss of consciousness.  The patient was struggled for the next 2 days until seeing her PCP because of dizziness and trouble concentrating.  Dizziness was provoked by head and eye movements.  Along with the symptoms the patient also developed tinnitus in the right ear.  The patient was referred for vestibular therapy which helped with her vertigo.  The patient reports that she has lost confidence in herself after her fall and she traditionally had always been confident in her abilities and skills and was always quite athletic throughout her life.  Now the patient is fearful of driving, difficulty doing normal activities and isolating herself from others.  She describes significant sleep disturbance that is aided by Ambien.  Patient has limited her range of foods that she eats and tends to eat 1 or 2 foods and nothing else over and over again.  The patient describes ongoing memory difficulties.  The patient also reports that she had a change in her sense of smell and taste after the concussion.  Her difficulties are impacting her family and everything in her life appears to be getting harder.  The patient had an MRI conducted on 04/07/2019 with the resulting impressions of normal brain MRI for age.  There were no findings that were explanatory of the patient's identified symptoms.  There was no indication of acute process and no indications of his significant microvascular ischemic changes or atrophy.  The patient continues to struggle with chronic pain issues and gait disturbance.  Depressive episode and issues dealing with significant psychosocial also remain problematic.  Treatment Interventions:  Cognitive/behavioral psychotherapeutic interventions and working on specific  coping skills and strategies and other behavioral and structural changes to improve adaptation and coping.  Participation Level:   Active  Participation Quality:  Appropriate and Attentive      Behavioral  Observation:  Well Groomed, Alert, and Appropriate.   Current Psychosocial Factors: The patient reports that she continues to have little or no interaction with her siblings and of greater stresses the lack of interaction with her niece and nephews.  She has been struggling with whether or not to contact one of her nephews who she had a close relationship prior to a major strain with her sister and the nephew's mother.  Content of Session:   Today we continue to work on issues related to major stressors that exacerbate her underlying chronic pain symptoms and panic attacks.  Effectiveness of Interventions: The patient has been very open and frank in our discussions and rapport has been easily established.  The patient is well motivated and has been actively working on the initial therapeutic interventions we have been developing.  Target Goals:   Target goals include managing and working through some of the residual effects of her postconcussion syndrome in the setting of more longstanding issues such as her major depressive events that have been severe in the past but are being effectively managed at this point through utilizing well learned coping skills and strategies as well as psychotropic medications.  Goals Last Reviewed:   11/04/2020  Goals Addressed Today:    One of the primary focuses of today's therapeutic intervention worked longstanding conflicts and stressors going back to childhood regarding relationships with her parents and siblings.  The patient is continuing to struggle with how to cope and adapt/adjust to childhood and young adult issues that continue to have a deleterious effect on her today.  Working on stressful triggers are important as far as managing and recovering from her postconcussional syndrome symptoms.  Impression/Diagnosis:    Brittney [name] is a 65 year old female that was referred by Corbin Ade, PA with Dr. Maryjean Ka office for neuropsychological interventions.   The patient has had increasing concerns about changes in behavior and overall functioning along with concerns about her medication and looking for therapeutic interventions around both acute and long-term issues.  The patient had a concussive event in June 2018 where she fell backwards down the stairs.  The patient suffered nerve damage in her right ear that affects her trigeminal nerve/neuralgia and also has experienced vertigo and balance disturbance.  The patient describes difficulties making decisions and getting caught in a "tunnel" and losing track of time and focus.  The patient has been isolating herself from others and experiencing increasing panic attacks.  The patient also describes increasing depression, anxiety, chronic pain and being consumed by anger and hatred.  The patient has a past medical history that includes depression, GERD, right trigeminal neuralgia, right CTS release, chronic pain, attentional deficits, hyperlipidemia, migraines and residual postconcussive syndrome.  The patient is continued to have difficulties particular around depressive symptoms and residual effects of her postconcussion syndrome.  We will continue to work on therapeutic interventions around coping and adjustment issues and managing her depression and panic events.  Anxiety and panic continue to be problematic and the patient is very socially isolated.  Diagnosis:   Postconcussion syndrome  Chronic pain syndrome  Moderate episode of recurrent major depressive disorder (HCC)    Ilean Skill, Psy.D. Clinical Psychologist Neuropsychologist

## 2020-12-29 ENCOUNTER — Ambulatory Visit (HOSPITAL_BASED_OUTPATIENT_CLINIC_OR_DEPARTMENT_OTHER): Payer: No Typology Code available for payment source | Admitting: Obstetrics & Gynecology

## 2020-12-30 ENCOUNTER — Encounter: Payer: No Typology Code available for payment source | Admitting: Psychology

## 2020-12-31 ENCOUNTER — Ambulatory Visit: Payer: No Typology Code available for payment source

## 2021-01-06 ENCOUNTER — Encounter (HOSPITAL_BASED_OUTPATIENT_CLINIC_OR_DEPARTMENT_OTHER): Payer: Self-pay | Admitting: Obstetrics & Gynecology

## 2021-01-06 ENCOUNTER — Other Ambulatory Visit (HOSPITAL_BASED_OUTPATIENT_CLINIC_OR_DEPARTMENT_OTHER)
Admission: RE | Admit: 2021-01-06 | Discharge: 2021-01-06 | Disposition: A | Discharge status: Home / Self Care | Payer: No Typology Code available for payment source | Source: Ambulatory Visit | Attending: Obstetrics & Gynecology | Admitting: Obstetrics & Gynecology

## 2021-01-06 ENCOUNTER — Ambulatory Visit (INDEPENDENT_AMBULATORY_CARE_PROVIDER_SITE_OTHER): Payer: No Typology Code available for payment source | Admitting: Obstetrics & Gynecology

## 2021-01-06 ENCOUNTER — Other Ambulatory Visit: Payer: Self-pay

## 2021-01-06 VITALS — BP 139/76 | HR 65 | Ht 62.0 in | Wt 152.0 lb

## 2021-01-06 DIAGNOSIS — F0781 Postconcussional syndrome: Secondary | ICD-10-CM

## 2021-01-06 DIAGNOSIS — Z780 Asymptomatic menopausal state: Secondary | ICD-10-CM | POA: Diagnosis not present

## 2021-01-06 DIAGNOSIS — Z01419 Encounter for gynecological examination (general) (routine) without abnormal findings: Secondary | ICD-10-CM | POA: Diagnosis not present

## 2021-01-06 DIAGNOSIS — M810 Age-related osteoporosis without current pathological fracture: Secondary | ICD-10-CM

## 2021-01-06 DIAGNOSIS — R5383 Other fatigue: Secondary | ICD-10-CM

## 2021-01-06 DIAGNOSIS — Z8742 Personal history of other diseases of the female genital tract: Secondary | ICD-10-CM | POA: Diagnosis not present

## 2021-01-06 DIAGNOSIS — F3341 Major depressive disorder, recurrent, in partial remission: Secondary | ICD-10-CM

## 2021-01-06 LAB — CBC
HCT: 37.6 % (ref 36.0–46.0)
Hemoglobin: 12.5 g/dL (ref 12.0–15.0)
MCH: 30.3 pg (ref 26.0–34.0)
MCHC: 33.2 g/dL (ref 30.0–36.0)
MCV: 91 fL (ref 80.0–100.0)
Platelets: 194 10*3/uL (ref 150–400)
RBC: 4.13 MIL/uL (ref 3.87–5.11)
RDW: 12.3 % (ref 11.5–15.5)
WBC: 4.8 10*3/uL (ref 4.0–10.5)
nRBC: 0 % (ref 0.0–0.2)

## 2021-01-06 LAB — COMPREHENSIVE METABOLIC PANEL
ALT: 14 U/L (ref 0–44)
AST: 22 U/L (ref 15–41)
Albumin: 4.5 g/dL (ref 3.5–5.0)
Alkaline Phosphatase: 57 U/L (ref 38–126)
Anion gap: 7 (ref 5–15)
BUN: 26 mg/dL — ABNORMAL HIGH (ref 8–23)
CO2: 29 mmol/L (ref 22–32)
Calcium: 9.5 mg/dL (ref 8.9–10.3)
Chloride: 104 mmol/L (ref 98–111)
Creatinine, Ser: 0.89 mg/dL (ref 0.44–1.00)
GFR, Estimated: >60 mL/min (ref >60)
Glucose, Bld: 96 mg/dL (ref 70–99)
Potassium: 4.3 mmol/L (ref 3.5–5.1)
Sodium: 140 mmol/L (ref 135–145)
Total Bilirubin: 0.4 mg/dL (ref 0.3–1.2)
Total Protein: 6.6 g/dL (ref 6.5–8.1)

## 2021-01-06 LAB — FERRITIN: Ferritin: 32 ng/mL (ref 11–307)

## 2021-01-06 LAB — VITAMIN D 25 HYDROXY (VIT D DEFICIENCY, FRACTURES): Vit D, 25-Hydroxy: 61.25 ng/mL (ref 30–100)

## 2021-01-06 LAB — TSH: TSH: 4.115 u[IU]/mL (ref 0.350–4.500)

## 2021-01-06 NOTE — Progress Notes (Signed)
65 y.o. G0P0 Married White or Caucasian female here for annual exam.  Reports she's been having a lot of fatigue that does not feel related to mood issues.    Denies vaginal bleeding.   Patient's last menstrual period was 12/08/2011 (exact date).          Sexually active: No.  The current method of family planning is post menopausal status.    Exercising: Yes.    elliptical, weight Smoker:  no  Health Maintenance: Pap:     10/28/2019 neg with neg HR HPV  08/29/18 Neg             01/27/16 ASCUS. HR HPV:neg 10/23/13 ASCUS.  02/06/13 Neg. HR HPV:neg History of abnormal Pap:  YesLGSIL, long hx of ASCUS paps MMG:  03/29/2020 Colonoscopy:  03/2018 BMD:   03/29/2020, osteoporosis.  Plan to repeat 2 years. TDaP:  01/30/18 Pneumonia vaccine(s):  07/25/17 Shingrix:   Declines for now Hep C testing: 2017 Screening Labs: today   reports that she has never smoked. She has never used smokeless tobacco. She reports that she does not drink alcohol and does not use drugs.  Past Medical History:  Diagnosis Date  . Anxiety   . Arthritis    knees and hands  . Depression   . GERD (gastroesophageal reflux disease)   . Headache(784.0)   . Trigeminal neuralgia     Past Surgical History:  Procedure Laterality Date  . ANKLE ARTHROPLASTY  2008   left  . CARPAL TUNNEL RELEASE  2009   rt  . CARPOMETACARPAL (CMC) FUSION OF THUMB Left 04/2019  . cortisone injection     total of 12 injections in muscle around neck, right shoulder, and spine  . EAR CYST EXCISION  09/18/2011   Procedure: CYST REMOVAL;  Surgeon: Beckie Salts, MD;  Location: [facility];  Service: ENT;  Laterality: Right;  right facial cyst  . EPIDURAL BLOCK INJECTION     in back and knee  . HAND ARTHROPLASTY  1989   lt  . JOINT REPLACEMENT  2009   rt total knee  . JOINT REPLACEMENT Left 05/06/2019   thumb joint, Dr. Amedeo Plenty  . KNEE ARTHROPLASTY  2010   revision rt total knee  . KNEE  ARTHROSCOPY  1973   rt  . PILONIDAL CYST / SINUS EXCISION  1977  . SEPTOPLASTY  2007  . SINUS EXPLORATION  2007  . TONSILLECTOMY    . TRIGGER FINGER RELEASE Left 07/05/2017    Current Outpatient Medications  Medication Sig Dispense Refill  . Alpha Lipoic Acid 200 MG CAPS 1/2 tablet    . amphetamine-dextroamphetamine (ADDERALL) 20 MG tablet Take 10 mg by mouth daily.    . Ascorbic Acid (VITAMIN C PO) Take by mouth.    . Biotin 5000 MCG CAPS Take by mouth daily.    . Buprenorphine HCl 600 MCG FILM Belbuca 600 mcg buccal film    . buPROPion (WELLBUTRIN XL) 300 MG 24 hr tablet Take 300 mg by mouth daily.    Marland Kitchen CALCIUM PO Take 1 capsule by mouth daily.     . celecoxib (CELEBREX) 100 MG capsule Take 100 mg by mouth daily.    . Cyanocobalamin (B-12 PO) Take 1 tablet by mouth daily.     Marland Kitchen escitalopram (LEXAPRO) 20 MG tablet Take 20 mg by mouth daily.    . methocarbamol (ROBAXIN) 750 MG tablet Take 750 mg by mouth every 6 (six) hours.    . mirtazapine (REMERON)  45 MG tablet Take 45 mg by mouth at bedtime.    . Multiple Vitamins-Minerals (MULTIVITAMIN PO) Take 1 tablet by mouth daily. Occasionally    . Omega-3 Fatty Acids (FISH OIL PO) Take 1 capsule by mouth daily.     Marland Kitchen VITAMIN D PO Take 3,000 Int'l Units by mouth.    . zolpidem (AMBIEN) 10 MG tablet Take 10 mg by mouth at bedtime.    . calcium carbonate (SUPER CALCIUM) 1500 (600 Ca) MG TABS tablet 2 tablets    . VYVANSE 70 MG capsule Take 1 tablet by mouth daily. (Patient not taking: Reported on 01/06/2021)  0   No current facility-administered medications for this visit.    Family History  Problem Relation Age of Onset  . COPD Mother        Deceased, 57  . Heart disease Father        Deceased, 79  . Diabetes Brother   . Stroke Brother     Review of Systems  Constitutional: Negative.   Gastrointestinal: Negative.   Genitourinary: Negative.     Exam:   BP 139/76 (BP Location: Left Arm, Patient Position: Sitting, Cuff Size:  Normal)   Pulse 65   Ht 5\' 2"  (1.575 m)   Wt 152 lb (68.9 kg)   LMP 12/08/2011 (Exact Date)   BMI 27.80 kg/m   Height: 5\' 2"  (157.5 cm)  General appearance: alert, cooperative and appears stated age Head: Normocephalic, without obvious abnormality, atraumatic Neck: no adenopathy, supple, symmetrical, trachea midline and thyroid normal to inspection and palpation Lungs: clear to auscultation bilaterally Breasts: normal appearance, no masses or tenderness Heart: regular rate and rhythm Abdomen: soft, non-tender; bowel sounds normal; no masses,  no organomegaly Extremities: extremities normal, atraumatic, no cyanosis or edema Skin: Skin color, texture, turgor normal. No rashes or lesions Lymph nodes: Cervical, supraclavicular, and axillary nodes normal. No abnormal inguinal nodes palpated Neurologic: Grossly normal   Pelvic: External genitalia:  no lesions              Urethra:  normal appearing urethra with no masses, tenderness or lesions              Bartholins and Skenes: normal                 Vagina: normal appearing vagina with normal color and discharge, no lesions              Cervix: no lesions              Pap taken: No. Bimanual Exam:  Uterus:  normal size, contour, position, consistency, mobility, non-tender              Adnexa: normal adnexa and no mass, fullness, tenderness               Rectovaginal: Confirms               Anus:  normal sphincter tone, no lesions  Chaperone, Cydney Ok, CMA, was present for exam.  Assessment/Plan: 1. Well woman exam with routine gynecological exam - neg pap with neg HR HPV 2021.  No pap obtained today - MMG 03/2020 - BMD 03/2020 - colonoscopy 2019 - Vaccines reviewed  2. Postmenopausal - no HRT  3. History of abnormal cervical Pap smear - as per above.  Reviewed currently guidelines.  Feel we should continue monitoring for now  4. Fatigue, unspecified type - CBC; Future - Comprehensive metabolic panel; Future - TSH;  Future -  VITAMIN D 25 Hydroxy (Vit-D Deficiency, Fractures); Future - Ferritin; Future  5. Osteoporosis without current pathological fracture, unspecified osteoporosis type - no treatment at this time - repeat 2023  6. Postconcussion syndrome  7. Recurrent major depressive disorder, in partial remission ([place])

## 2021-01-12 ENCOUNTER — Other Ambulatory Visit: Payer: Self-pay

## 2021-01-12 ENCOUNTER — Encounter: Payer: No Typology Code available for payment source | Attending: Psychology | Admitting: Psychology

## 2021-01-12 DIAGNOSIS — F0781 Postconcussional syndrome: Secondary | ICD-10-CM

## 2021-01-12 DIAGNOSIS — G894 Chronic pain syndrome: Secondary | ICD-10-CM | POA: Diagnosis not present

## 2021-01-12 DIAGNOSIS — F331 Major depressive disorder, recurrent, moderate: Secondary | ICD-10-CM | POA: Diagnosis not present

## 2021-01-16 ENCOUNTER — Encounter: Payer: Self-pay | Admitting: Psychology

## 2021-01-16 NOTE — Progress Notes (Signed)
Neuropsychology Visit  Patient:  Brittney [name]   DOB: 06-Jan-1956  MR Number: [id number]  Location: [facility] [place], [place] [id number] [address] [place] [address] Dept: [phone]  Date of Service: 01/12/2021  Start: 8 AM End: 9 AM  Duration of Service: 1 Hour  Today's visit was an in person visit was conducted in my outpatient clinic office.  The patient and myself were present for this visit.  Provider/Observer:     Edgardo Roys PsyD  Chief Complaint:      Chief Complaint  Patient presents with  . Depression  . Pain  . Other    Reason For Service:     Brittney [name] is a 65 year old female that was referred by Corbin Ade, PA with Dr. Maryjean Ka office for neuropsychological interventions.  The patient has had increasing concerns about changes in behavior and overall functioning along with concerns about her medication and looking for therapeutic interventions around both acute and long-term issues.  The patient had a concussive event in June 2018 where she fell backwards down the stairs.  The patient suffered nerve damage in her right ear that affects her trigeminal nerve/neuralgia and also has experienced vertigo and balance disturbance.  The patient describes difficulties making decisions and getting caught in a "tunnel" and losing track of time and focus.  The patient has been isolating herself from others and experiencing increasing panic attacks.  The patient also describes increasing depression, anxiety, chronic pain and being consumed by anger and hatred.  The patient has a past medical history that includes depression, GERD, right trigeminal neuralgia, right CTS release, chronic pain, attentional deficits, hyperlipidemia, migraines and residual postconcussive syndrome.  In her fall that occurred in June 2018 she hit her head on concrete suffering no  laceration or loss of consciousness.  The patient was struggled for the next 2 days until seeing her PCP because of dizziness and trouble concentrating.  Dizziness was provoked by head and eye movements.  Along with the symptoms the patient also developed tinnitus in the right ear.  The patient was referred for vestibular therapy which helped with her vertigo.  The patient reports that she has lost confidence in herself after her fall and she traditionally had always been confident in her abilities and skills and was always quite athletic throughout her life.  Now the patient is fearful of driving, difficulty doing normal activities and isolating herself from others.  She describes significant sleep disturbance that is aided by Ambien.  Patient has limited her range of foods that she eats and tends to eat 1 or 2 foods and nothing else over and over again.  The patient describes ongoing memory difficulties.  The patient also reports that she had a change in her sense of smell and taste after the concussion.  Her difficulties are impacting her family and everything in her life appears to be getting harder.  The patient had an MRI conducted on 04/07/2019 with the resulting impressions of normal brain MRI for age.  There were no findings that were explanatory of the patient's identified symptoms.  There was no indication of acute process and no indications of his significant microvascular ischemic changes or atrophy.  The patient continues to struggle with chronic pain issues and gait disturbance.  Depressive episode and issues dealing with significant psychosocial also remain problematic.  Treatment Interventions:  Cognitive/behavioral psychotherapeutic interventions and working on specific coping  skills and strategies and other behavioral and structural changes to improve adaptation and coping.  Participation Level:   Active  Participation Quality:  Appropriate and Attentive      Behavioral  Observation:  Well Groomed, Alert, and Appropriate.   Current Psychosocial Factors: The patient reports that she continues to have little or no interaction with her siblings and of greater stresses the lack of interaction with her niece and nephews.  She has been struggling with whether or not to contact one of her nephews who she had a close relationship prior to a major strain with her sister and the nephew's mother.  Content of Session:   Today we continue to work on issues related to major stressors that exacerbate her underlying chronic pain symptoms and panic attacks.  Effectiveness of Interventions: The patient has been very open and frank in our discussions and rapport has been easily established.  The patient is well motivated and has been actively working on the initial therapeutic interventions we have been developing.  Target Goals:   Target goals include managing and working through some of the residual effects of her postconcussion syndrome in the setting of more longstanding issues such as her major depressive events that have been severe in the past but are being effectively managed at this point through utilizing well learned coping skills and strategies as well as psychotropic medications.  Goals Last Reviewed:   01/12/2021  Goals Addressed Today:    One of the primary focuses of today's therapeutic intervention worked longstanding conflicts and stressors going back to childhood regarding relationships with her parents and siblings.  The patient is continuing to struggle with how to cope and adapt/adjust to childhood and young adult issues that continue to have a deleterious effect on her today.  Working on stressful triggers are important as far as managing and recovering from her postconcussional syndrome symptoms.  Impression/Diagnosis:    Brittney [name] is a 65 year old female that was referred by Corbin Ade, PA with Dr. Maryjean Ka office for neuropsychological interventions.  The  patient has had increasing concerns about changes in behavior and overall functioning along with concerns about her medication and looking for therapeutic interventions around both acute and long-term issues.  The patient had a concussive event in June 2018 where she fell backwards down the stairs.  The patient suffered nerve damage in her right ear that affects her trigeminal nerve/neuralgia and also has experienced vertigo and balance disturbance.  The patient describes difficulties making decisions and getting caught in a "tunnel" and losing track of time and focus.  The patient has been isolating herself from others and experiencing increasing panic attacks.  The patient also describes increasing depression, anxiety, chronic pain and being consumed by anger and hatred.  The patient has a past medical history that includes depression, GERD, right trigeminal neuralgia, right CTS release, chronic pain, attentional deficits, hyperlipidemia, migraines and residual postconcussive syndrome.  The patient is continued to have difficulties particular around depressive symptoms and residual effects of her postconcussion syndrome.  We will continue to work on therapeutic interventions around coping and adjustment issues and managing her depression and panic events.  Anxiety and panic continue to be problematic and the patient is very socially isolated.  Diagnosis:   Postconcussion syndrome  Chronic pain syndrome  Moderate episode of recurrent major depressive disorder (HCC)    Ilean Skill, Psy.D. Clinical Psychologist Neuropsychologist

## 2021-01-26 ENCOUNTER — Ambulatory Visit: Payer: No Typology Code available for payment source | Admitting: Psychology

## 2021-02-22 ENCOUNTER — Encounter: Payer: No Typology Code available for payment source | Admitting: Psychology

## 2021-03-17 ENCOUNTER — Ambulatory Visit: Payer: No Typology Code available for payment source | Admitting: Psychology

## 2021-05-18 ENCOUNTER — Ambulatory Visit: Payer: No Typology Code available for payment source | Admitting: Psychology

## 2021-06-23 ENCOUNTER — Other Ambulatory Visit: Payer: Self-pay | Admitting: Family Medicine

## 2021-06-23 DIAGNOSIS — Z1231 Encounter for screening mammogram for malignant neoplasm of breast: Secondary | ICD-10-CM

## 2021-06-24 ENCOUNTER — Ambulatory Visit
Admission: RE | Admit: 2021-06-24 | Discharge: 2021-06-24 | Disposition: A | Discharge status: Home / Self Care | Payer: Medicare HMO | Source: Ambulatory Visit | Attending: Family Medicine | Admitting: Family Medicine

## 2021-06-24 ENCOUNTER — Other Ambulatory Visit: Payer: Self-pay

## 2021-06-24 DIAGNOSIS — Z1231 Encounter for screening mammogram for malignant neoplasm of breast: Secondary | ICD-10-CM

## 2021-07-01 ENCOUNTER — Other Ambulatory Visit: Payer: Self-pay | Admitting: Family Medicine

## 2021-07-01 DIAGNOSIS — R928 Other abnormal and inconclusive findings on diagnostic imaging of breast: Secondary | ICD-10-CM

## 2021-07-08 ENCOUNTER — Other Ambulatory Visit: Payer: Self-pay | Admitting: Family Medicine

## 2021-07-08 DIAGNOSIS — B370 Candidal stomatitis: Secondary | ICD-10-CM | POA: Diagnosis not present

## 2021-07-08 DIAGNOSIS — R928 Other abnormal and inconclusive findings on diagnostic imaging of breast: Secondary | ICD-10-CM

## 2021-07-08 DIAGNOSIS — Z23 Encounter for immunization: Secondary | ICD-10-CM | POA: Diagnosis not present

## 2021-07-11 DIAGNOSIS — M25562 Pain in left knee: Secondary | ICD-10-CM | POA: Diagnosis not present

## 2021-07-11 DIAGNOSIS — G500 Trigeminal neuralgia: Secondary | ICD-10-CM | POA: Diagnosis not present

## 2021-07-11 DIAGNOSIS — G894 Chronic pain syndrome: Secondary | ICD-10-CM | POA: Diagnosis not present

## 2021-07-11 DIAGNOSIS — Z6828 Body mass index (BMI) 28.0-28.9, adult: Secondary | ICD-10-CM | POA: Diagnosis not present

## 2021-07-11 DIAGNOSIS — M5416 Radiculopathy, lumbar region: Secondary | ICD-10-CM | POA: Diagnosis not present

## 2021-07-11 DIAGNOSIS — R030 Elevated blood-pressure reading, without diagnosis of hypertension: Secondary | ICD-10-CM | POA: Diagnosis not present

## 2021-07-14 ENCOUNTER — Ambulatory Visit
Admission: RE | Admit: 2021-07-14 | Discharge: 2021-07-14 | Disposition: A | Discharge status: Home / Self Care | Payer: Medicare HMO | Source: Ambulatory Visit | Attending: Family Medicine | Admitting: Family Medicine

## 2021-07-14 ENCOUNTER — Other Ambulatory Visit: Payer: Self-pay

## 2021-07-14 DIAGNOSIS — R928 Other abnormal and inconclusive findings on diagnostic imaging of breast: Secondary | ICD-10-CM

## 2021-07-14 DIAGNOSIS — R922 Inconclusive mammogram: Secondary | ICD-10-CM | POA: Diagnosis not present

## 2021-07-26 DIAGNOSIS — G479 Sleep disorder, unspecified: Secondary | ICD-10-CM | POA: Diagnosis not present

## 2021-07-26 DIAGNOSIS — Z79899 Other long term (current) drug therapy: Secondary | ICD-10-CM | POA: Diagnosis not present

## 2021-07-26 DIAGNOSIS — F419 Anxiety disorder, unspecified: Secondary | ICD-10-CM | POA: Diagnosis not present

## 2021-07-26 DIAGNOSIS — E785 Hyperlipidemia, unspecified: Secondary | ICD-10-CM | POA: Diagnosis not present

## 2021-07-26 DIAGNOSIS — Z0000 Encounter for general adult medical examination without abnormal findings: Secondary | ICD-10-CM | POA: Diagnosis not present

## 2021-07-26 DIAGNOSIS — Z1159 Encounter for screening for other viral diseases: Secondary | ICD-10-CM | POA: Diagnosis not present

## 2021-07-26 DIAGNOSIS — E039 Hypothyroidism, unspecified: Secondary | ICD-10-CM | POA: Diagnosis not present

## 2021-07-26 DIAGNOSIS — M810 Age-related osteoporosis without current pathological fracture: Secondary | ICD-10-CM | POA: Diagnosis not present

## 2021-07-26 DIAGNOSIS — Z23 Encounter for immunization: Secondary | ICD-10-CM | POA: Diagnosis not present

## 2021-08-18 DIAGNOSIS — J3489 Other specified disorders of nose and nasal sinuses: Secondary | ICD-10-CM | POA: Diagnosis not present

## 2021-08-18 DIAGNOSIS — M950 Acquired deformity of nose: Secondary | ICD-10-CM | POA: Diagnosis not present

## 2021-08-22 DIAGNOSIS — G500 Trigeminal neuralgia: Secondary | ICD-10-CM | POA: Diagnosis not present

## 2021-08-22 DIAGNOSIS — M5416 Radiculopathy, lumbar region: Secondary | ICD-10-CM | POA: Diagnosis not present

## 2021-08-22 DIAGNOSIS — G894 Chronic pain syndrome: Secondary | ICD-10-CM | POA: Diagnosis not present

## 2021-09-07 DIAGNOSIS — G4763 Sleep related bruxism: Secondary | ICD-10-CM | POA: Diagnosis not present

## 2021-09-07 DIAGNOSIS — F330 Major depressive disorder, recurrent, mild: Secondary | ICD-10-CM | POA: Diagnosis not present

## 2021-09-07 DIAGNOSIS — G478 Other sleep disorders: Secondary | ICD-10-CM | POA: Diagnosis not present

## 2021-09-07 DIAGNOSIS — G4700 Insomnia, unspecified: Secondary | ICD-10-CM | POA: Diagnosis not present

## 2021-09-07 DIAGNOSIS — G8929 Other chronic pain: Secondary | ICD-10-CM | POA: Diagnosis not present

## 2021-09-20 DIAGNOSIS — F331 Major depressive disorder, recurrent, moderate: Secondary | ICD-10-CM | POA: Diagnosis not present

## 2021-09-20 DIAGNOSIS — G4700 Insomnia, unspecified: Secondary | ICD-10-CM | POA: Diagnosis not present

## 2021-10-17 DIAGNOSIS — Z6827 Body mass index (BMI) 27.0-27.9, adult: Secondary | ICD-10-CM | POA: Diagnosis not present

## 2021-10-17 DIAGNOSIS — G894 Chronic pain syndrome: Secondary | ICD-10-CM | POA: Diagnosis not present

## 2021-10-17 DIAGNOSIS — F1120 Opioid dependence, uncomplicated: Secondary | ICD-10-CM | POA: Diagnosis not present

## 2021-10-17 DIAGNOSIS — G500 Trigeminal neuralgia: Secondary | ICD-10-CM | POA: Diagnosis not present

## 2021-10-17 DIAGNOSIS — M5416 Radiculopathy, lumbar region: Secondary | ICD-10-CM | POA: Diagnosis not present

## 2021-10-19 DIAGNOSIS — G4733 Obstructive sleep apnea (adult) (pediatric): Secondary | ICD-10-CM | POA: Diagnosis not present

## 2021-10-21 DIAGNOSIS — F330 Major depressive disorder, recurrent, mild: Secondary | ICD-10-CM | POA: Diagnosis not present

## 2021-10-21 DIAGNOSIS — G8929 Other chronic pain: Secondary | ICD-10-CM | POA: Diagnosis not present

## 2021-10-21 DIAGNOSIS — G4733 Obstructive sleep apnea (adult) (pediatric): Secondary | ICD-10-CM | POA: Diagnosis not present

## 2021-10-21 DIAGNOSIS — G4700 Insomnia, unspecified: Secondary | ICD-10-CM | POA: Diagnosis not present

## 2021-11-08 ENCOUNTER — Encounter: Payer: Self-pay | Admitting: Neurology

## 2021-11-30 DIAGNOSIS — M79641 Pain in right hand: Secondary | ICD-10-CM | POA: Diagnosis not present

## 2021-11-30 DIAGNOSIS — G500 Trigeminal neuralgia: Secondary | ICD-10-CM | POA: Diagnosis not present

## 2021-11-30 DIAGNOSIS — F1120 Opioid dependence, uncomplicated: Secondary | ICD-10-CM | POA: Diagnosis not present

## 2021-11-30 DIAGNOSIS — M79642 Pain in left hand: Secondary | ICD-10-CM | POA: Diagnosis not present

## 2021-11-30 DIAGNOSIS — G894 Chronic pain syndrome: Secondary | ICD-10-CM | POA: Diagnosis not present

## 2021-11-30 DIAGNOSIS — M25562 Pain in left knee: Secondary | ICD-10-CM | POA: Diagnosis not present

## 2021-12-21 DIAGNOSIS — M950 Acquired deformity of nose: Secondary | ICD-10-CM | POA: Diagnosis not present

## 2021-12-21 DIAGNOSIS — J342 Deviated nasal septum: Secondary | ICD-10-CM | POA: Diagnosis not present

## 2021-12-21 DIAGNOSIS — J3489 Other specified disorders of nose and nasal sinuses: Secondary | ICD-10-CM | POA: Diagnosis not present

## 2022-01-02 ENCOUNTER — Ambulatory Visit: Payer: Medicare HMO | Admitting: Neurology

## 2022-01-02 ENCOUNTER — Encounter: Payer: Self-pay | Admitting: Neurology

## 2022-01-02 ENCOUNTER — Other Ambulatory Visit: Payer: Self-pay

## 2022-01-02 VITALS — BP 118/78 | HR 70 | Ht 62.0 in | Wt 142.0 lb

## 2022-01-02 DIAGNOSIS — L989 Disorder of the skin and subcutaneous tissue, unspecified: Secondary | ICD-10-CM | POA: Diagnosis not present

## 2022-01-02 DIAGNOSIS — G500 Trigeminal neuralgia: Secondary | ICD-10-CM

## 2022-01-02 NOTE — Progress Notes (Signed)
?[facility] ?Neurology Division ?Clinic Note - Initial Visit ? ? ?Date: 01/02/22 ? ?Brittney [name] ?MRN: [id number] ?DOB: 27-Sep-1956 ? ? ?Dear Brittney Craft, PA-C: ? ?Thank you for your kind referral of Brittney [name] for consultation of trigeminal neuralgia. Although her history is well known to you, please allow [place] to reiterate it for the purpose of our medical record. The patient was accompanied to the clinic by self. ?  ? ?History of Present Illness: ?Brittney [name] is a 66 y.o. right-handed female with depression, GERD, right trigeminal neuralgia s/p RFA (followed by Dr. Vertell Limber), right CTS s/p release, chronic pain, ADD, hyperlipidemia, and post-concussive syndrome presenting for evaluation of right trigeminal neuralgia.  Upon further questioning, patient tells me that she is not here for trigeminal neuralgia, but for a mass on her scalp.  Her trigeminal neuralgia has been a chronic problem and she takes alpha lipoic acid 300-'400mg'$  daily which helps.  She recently underwent nasal surgery and reports that her pain has significantly improved over the past two weeks.  ? ?She is more concerned about a bump on the top of her head which has been getting larger over the past 1.5 years.  She denies tenderness or pain.  She has not shown this to her PCP. ? ?Out-side paper records, electronic medical record, and images have been reviewed where available and summarized as:  ?Lab Results  ?Component Value Date  ? HGBA1C 5.7 (H) 01/27/2016  ? ?Lab Results  ?Component Value Date  ? QJFHLKTG25 1,112 (H) 07/10/2014  ? ?Lab Results  ?Component Value Date  ? TSH 4.115 01/06/2021  ? ?Lab Results  ?Component Value Date  ? ESRSEDRATE 7 08/29/2018  ? ? ?Past Medical History:  ?Diagnosis Date  ? Anxiety   ? Arthritis   ? knees and hands  ? Depression   ? GERD (gastroesophageal reflux disease)   ? Headache(784.0)   ? Trigeminal neuralgia   ? ? ?Past Surgical History:  ?Procedure Laterality Date  ? ANKLE ARTHROPLASTY   2008  ? left  ? CARPAL TUNNEL RELEASE  2009  ? rt  ? CARPOMETACARPAL (CMC) FUSION OF THUMB Left 04/2019  ? cortisone injection    ? total of 12 injections in muscle around neck, right shoulder, and spine  ? EAR CYST EXCISION  09/18/2011  ? Procedure: CYST REMOVAL;  Surgeon: Beckie Salts, MD;  Location: [facility];  Service: ENT;  Laterality: Right;  right facial cyst  ? EPIDURAL BLOCK INJECTION    ? in back and knee  ? HAND ARTHROPLASTY  1989  ? lt  ? JOINT REPLACEMENT  2009  ? rt total knee  ? JOINT REPLACEMENT Left 05/06/2019  ? thumb joint, Dr. Amedeo Plenty  ? KNEE ARTHROPLASTY  2010  ? revision rt total knee  ? KNEE ARTHROSCOPY  1973  ? rt  ? PILONIDAL CYST / SINUS EXCISION  1977  ? SEPTOPLASTY  2007  ? SINUS EXPLORATION  2007  ? TONSILLECTOMY    ? TRIGGER FINGER RELEASE Left 07/05/2017  ? ? ? ?Medications:  ?Outpatient Encounter Medications as of 01/02/2022  ?Medication Sig Note  ? Alpha Lipoic Acid 200 MG CAPS 1/2 tablet   ? ARIPiprazole (ABILIFY) 2 MG tablet 1 tablet   ? Ascorbic Acid (VITAMIN C PO) Take by mouth.   ? Biotin 5000 MCG CAPS Take by mouth daily.   ? Buprenorphine HCl 600 MCG FILM Belbuca 600 mcg buccal film   ? buPROPion (WELLBUTRIN XL) 300  MG 24 hr tablet Take 300 mg by mouth daily.   ? calcium carbonate (OSCAL) 1500 (600 Ca) MG TABS tablet 2 tablets   ? celecoxib (CELEBREX) 200 MG capsule Take 100 mg by mouth daily.   ? Cyanocobalamin (B-12 PO) Take 1 tablet by mouth daily.    ? escitalopram (LEXAPRO) 20 MG tablet Take 20 mg by mouth daily.   ? HYDROcodone-acetaminophen (NORCO) 10-325 MG tablet take 1 tablet by oral route Q 4-6 prn severe pain   ? methocarbamol (ROBAXIN) 750 MG tablet Take 750 mg by mouth every 6 (six) hours.   ? mirtazapine (REMERON) 45 MG tablet Take 45 mg by mouth at bedtime.   ? Multiple Vitamins-Minerals (MULTIVITAMIN PO) Take 1 tablet by mouth daily. Occasionally   ? Omega-3 Fatty Acids (FISH OIL PO) Take 1 capsule by mouth daily.    ? VITAMIN D PO Take 3,000  Int'l Units by mouth.   ? zolpidem (AMBIEN) 5 MG tablet Take 5 mg by mouth at bedtime.   ? [DISCONTINUED] amphetamine-dextroamphetamine (ADDERALL) 20 MG tablet Take 10 mg by mouth daily. (Patient not taking: Reported on 01/02/2022)   ? [DISCONTINUED] CALCIUM PO Take 1 capsule by mouth daily.  (Patient not taking: Reported on 01/02/2022)   ? [DISCONTINUED] VYVANSE 70 MG capsule Take 1 tablet by mouth daily. (Patient not taking: Reported on 01/06/2021) 01/27/2016: Received from: External Pharmacy Received Sig: TAKE 1 CAPSULE BY MOUTH EVERY MORNING CAN FILL 3.16  ? ?No facility-administered encounter medications on file as of 01/02/2022.  ? ? ?Allergies:  ?Allergies  ?Allergen Reactions  ? Asa Arthritis Strength-Antacid [Aspirin Buffered]   ? Carbamazepine Other (See Comments)  ? Celebrex [Celecoxib] Other (See Comments)  ?  Stomach pain  ? Cephalexin   ? Chlorpromazine Hcl Other (See Comments)  ? Cymbalta [Duloxetine Hcl] Other (See Comments)  ? Darvocet [Propoxyphene N-Acetaminophen]   ? Dexilant [Dexlansoprazole] Other (See Comments)  ? Diazepam Other (See Comments)  ?  migraines  ? Dilantin [Phenytoin Sodium Extended] Other (See Comments)  ?  Zombie like feeling  ? Klonopin [Clonazepam] Other (See Comments)  ?  Makes her feel like a zombie  ? Lamictal [Lamotrigine]   ? Lyrica [Pregabalin]   ? Neurontin [Gabapentin] Other (See Comments)  ? Nubain [Nalbuphine Hcl]   ?  Spasms-itching  ? Oxcarbazepine   ?  Increase depression/severe constipation  ? Phenytoin Sodium Extended   ?  Other reaction(s): Other (See Comments) ?Zombie like feeling  ? Prednisone Other (See Comments)  ?  Headaches  ? Propoxyphene   ?  Other reaction(s): Other (See Comments)  ? Thorazine [Chlorpromazine Hcl]   ? Tolmetin   ?  Other reaction(s): Other (See Comments) ?Gi upset  ? Topamax [Topiramate] Photosensitivity  ? ? ?Family History: ?Family History  ?Problem Relation Age of Onset  ? COPD Mother   ?     Deceased, 27  ? Heart disease Father   ?      Deceased, 64  ? Diabetes Brother   ? Stroke Brother   ? ? ?Social History: ?Social History  ? ?Tobacco Use  ? Smoking status: Never  ? Smokeless tobacco: Never  ?Vaping Use  ? Vaping Use: Never used  ?Substance Use Topics  ? Alcohol use: No  ?  Alcohol/week: 0.0 standard drinks  ? Drug use: No  ? ?Social History  ? ?Social History Narrative  ? 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  ? She is  in the process of applying for disability.  ? She is lives with partner.  ? Highest level of education:  [employer].  ? Lives in a 2 story home.    ?   ? Right Handed   ? ? ?Vital Signs:  ?BP 118/78   Pulse 70   Ht '5\' 2"'$  (1.575 m)   Wt 142 lb (64.4 kg)   LMP 12/08/2011 (Exact Date)   SpO2 97%   BMI 25.97 kg/m?  ?  ?General Medical Exam:   ?General:  Well appearing, comfortable.   ?Eyes/ENT: Scalp was examined and there is a mild protuberance of the skull at the vertex, no palpable mobile structure, no skin changes were visible ? ?Neurological Exam: ?MENTAL STATUS including orientation to time, place, person, recent and remote memory, attention span and concentration, language, and fund of knowledge is normal.  Speech is not dysarthric. ? ?CRANIAL NERVES: ?II:  No visual field defects.   ?III-IV-VI: Pupils equal round and reactive to light.  Normal conjugate, extra-ocular eye movements in all directions of gaze.  No nystagmus.  No ptosis.   ?V:  Normal facial sensation.    ?VII:  Normal facial symmetry and movements.   ?VIII:  Normal hearing and vestibular function.   ?IX-X:  Normal palatal movement.   ?XI:  Normal shoulder shrug and head rotation.   ?XII:  Normal tongue strength and range of motion, no deviation or fasciculation. ? ?MOTOR:  Motor strength is 5/5 throughout. No atrophy, fasciculations or abnormal movements.  No pronator drift.  ? ?MSRs:  ?Right        Left                  ?brachioradialis 2+  2+  ?biceps 2+  2+  ?triceps 2+  2+  ?patellar 2+  2+  ?ankle jerk 2+  2+  ?Hoffman no  no   ?plantar response down  down  ? ?SENSORY:  Normal and symmetric perception of light touch, pinprick, vibration, and proprioception.   ? ?COORDINATION/GAIT: Normal finger-to- nose-finger.  Intact rapid alternat

## 2022-01-02 NOTE — Patient Instructions (Signed)
CT head without contrast ? ?

## 2022-01-09 ENCOUNTER — Ambulatory Visit (INDEPENDENT_AMBULATORY_CARE_PROVIDER_SITE_OTHER): Payer: Medicare HMO | Admitting: Obstetrics & Gynecology

## 2022-01-09 ENCOUNTER — Other Ambulatory Visit (HOSPITAL_COMMUNITY)
Admission: RE | Admit: 2022-01-09 | Discharge: 2022-01-09 | Disposition: A | Discharge status: Home / Self Care | Payer: Medicare HMO | Source: Ambulatory Visit | Attending: Obstetrics & Gynecology | Admitting: Obstetrics & Gynecology

## 2022-01-09 ENCOUNTER — Encounter (HOSPITAL_BASED_OUTPATIENT_CLINIC_OR_DEPARTMENT_OTHER): Payer: Self-pay | Admitting: Obstetrics & Gynecology

## 2022-01-09 VITALS — BP 121/73 | HR 63 | Ht 62.0 in | Wt 145.2 lb

## 2022-01-09 DIAGNOSIS — Z1231 Encounter for screening mammogram for malignant neoplasm of breast: Secondary | ICD-10-CM | POA: Diagnosis not present

## 2022-01-09 DIAGNOSIS — Z124 Encounter for screening for malignant neoplasm of cervix: Secondary | ICD-10-CM

## 2022-01-09 DIAGNOSIS — F0781 Postconcussional syndrome: Secondary | ICD-10-CM

## 2022-01-09 DIAGNOSIS — Z8742 Personal history of other diseases of the female genital tract: Secondary | ICD-10-CM | POA: Diagnosis not present

## 2022-01-09 DIAGNOSIS — F3341 Major depressive disorder, recurrent, in partial remission: Secondary | ICD-10-CM

## 2022-01-09 DIAGNOSIS — M816 Localized osteoporosis [Lequesne]: Secondary | ICD-10-CM

## 2022-01-09 NOTE — Progress Notes (Signed)
66 y.o. G0P0 Married White or Caucasian female here for breast and pelvic exam.  H/o septorhinoplasty 12/29/21.  Still has some facial bruising.  Has, still, several limitations.   ? ?Denies vaginal bleeding. ? ?H/o osteoporosis, mild, that we are following conservatively.  Needs BMD this year. ? ?Patient's last menstrual period was 12/08/2011 (exact date).          ?H/O STD:  no ? ?Health Maintenance: ?PCP:  Stephanie Acre.  Last wellness appt was 07/2021.  Did blood work at that appt:  yes ?Vaccines are up to date:  yes, except hasn't finished shingrix vaccination ?Colonoscopy:  03/20/2018, follow up 10 years ?MMG:  06/24/2021 Additional images recommended ?BMD:  03/29/2020 Osteoporosis in right hip ?Last pap smear:  10/28/2019 Negative.   ?H/o abnormal pap smear:  long hx of ascus or lgsil pap with neg HR HPV ? ? ? reports that she has never smoked. She has never used smokeless tobacco. She reports that she does not drink alcohol and does not use drugs. ? ?Past Medical History:  ?Diagnosis Date  ? Anxiety   ? Arthritis   ? knees and hands  ? Depression   ? GERD (gastroesophageal reflux disease)   ? Headache(784.0)   ? Trigeminal neuralgia   ? ? ?Past Surgical History:  ?Procedure Laterality Date  ? ANKLE ARTHROPLASTY  2008  ? left  ? CARPAL TUNNEL RELEASE  2009  ? rt  ? CARPOMETACARPAL (CMC) FUSION OF THUMB Left 04/2019  ? cortisone injection    ? total of 12 injections in muscle around neck, right shoulder, and spine  ? EAR CYST EXCISION  09/18/2011  ? Procedure: CYST REMOVAL;  Surgeon: Beckie Salts, MD;  Location: [place];  Service: ENT;  Laterality: Right;  right facial cyst  ? EPIDURAL BLOCK INJECTION    ? in back and knee  ? HAND ARTHROPLASTY  1989  ? lt  ? JOINT REPLACEMENT  2009  ? rt total knee  ? JOINT REPLACEMENT Left 05/06/2019  ? thumb joint, Dr. Amedeo Plenty  ? KNEE ARTHROPLASTY  2010  ? revision rt total knee  ? KNEE ARTHROSCOPY  1973  ? rt  ? PILONIDAL CYST / SINUS EXCISION  1977  ?  SEPTOPLASTY  2007  ? SINUS EXPLORATION  2007  ? TONSILLECTOMY    ? TRIGGER FINGER RELEASE Left 07/05/2017  ? ? ?Current Outpatient Medications  ?Medication Sig Dispense Refill  ? Alpha Lipoic Acid 200 MG CAPS 1/2 tablet    ? ARIPiprazole (ABILIFY) 2 MG tablet 1 tablet    ? Ascorbic Acid (VITAMIN C PO) Take by mouth.    ? Biotin 5000 MCG CAPS Take by mouth daily.    ? Buprenorphine HCl 600 MCG FILM [place] 600 mcg buccal film    ? buPROPion (WELLBUTRIN XL) 300 MG 24 hr tablet Take 300 mg by mouth daily.    ? calcium carbonate (OSCAL) 1500 (600 Ca) MG TABS tablet 2 tablets    ? celecoxib (CELEBREX) 200 MG capsule Take 100 mg by mouth daily.    ? Cyanocobalamin (B-12 PO) Take 1 tablet by mouth daily.     ? escitalopram (LEXAPRO) 20 MG tablet Take 20 mg by mouth daily.    ? HYDROcodone-acetaminophen (NORCO) 10-325 MG tablet take 1 tablet by oral route Q 4-6 prn severe pain    ? Melatonin 10 MG CAPS Take by mouth.    ? methocarbamol (ROBAXIN) 750 MG tablet Take 750 mg by mouth every  6 (six) hours.    ? mirtazapine (REMERON) 45 MG tablet Take 45 mg by mouth at bedtime.    ? Multiple Vitamins-Minerals (MULTIVITAMIN PO) Take 1 tablet by mouth daily. Occasionally    ? Omega-3 Fatty Acids (FISH OIL PO) Take 1 capsule by mouth daily.     ? VITAMIN D PO Take 3,000 Int'l Units by mouth.    ? zolpidem (AMBIEN) 5 MG tablet Take 5 mg by mouth at bedtime.    ? ?No current facility-administered medications for this visit.  ? ? ?Family History  ?Problem Relation Age of Onset  ? COPD Mother   ?     Deceased, 14  ? Heart disease Father   ?     Deceased, 73  ? Diabetes Brother   ? Stroke Brother   ? ? ?Review of Systems  ?All other systems reviewed and are negative. ? ?Exam:   ?BP 121/73 (BP Location: Left Arm, Patient Position: Sitting, Cuff Size: Normal)   Pulse 63   Ht '5\' 2"'$  (1.575 m) Comment: reported  Wt 145 lb 3.2 oz (65.9 kg)   LMP 12/08/2011 (Exact Date)   BMI 26.56 kg/m?   Height: '5\' 2"'$  (157.5 cm) (reported) ? ?General  appearance: alert, cooperative and appears stated age ?Breasts: normal appearance, no masses or tenderness ?Abdomen: soft, non-tender; bowel sounds normal; no masses,  no organomegaly ?Lymph nodes: Cervical, supraclavicular, and axillary nodes normal.  No abnormal inguinal nodes palpated ?Neurologic: Grossly normal ? ?Pelvic: External genitalia:  no lesions ?             Urethra:  normal appearing urethra with no masses, tenderness or lesions ?             Bartholins and Skenes: normal    ?             Vagina: normal appearing vagina with atrophic changes and no discharge, no lesions ?             Cervix: no lesions ?             Pap taken: Yes.   ?Bimanual Exam:  Uterus:  normal size, contour, position, consistency, mobility, non-tender ?             Adnexa: normal adnexa and no mass, fullness, tenderness ?              Rectovaginal: Confirms ?              Anus:  normal sphincter tone, no lesions, internal hemorrhoid at 12 o'clock ? ?Chaperone, Octaviano Batty, CMA, was present for exam. ? ?Assessment/Plan: ?1. Encounter for screening mammogram for malignant neoplasm of breast ?- pap smear obtained today ?- MM 3D SCREEN BREAST BILATERAL; Future ?- colonoscopy 2019 ?- BMD ordered ?- lab work done with Dr. Stephanie Acre ?- vaccines reviewed/updated ? ?2. Localized osteoporosis without current pathological fracture ?- DG BONE DENSITY (DXA); Future ? ?3. Cervical cancer screening ?- Cytology - PAP( [place]) ?- PR OBTAINING SCREEN PAP SMEAR ? ?4. Postconcussion syndrome ? ?5. Recurrent major depressive disorder, in partial remission ([place]) ? ?6. History of abnormal cervical Pap smear with neg HR HPV ?- h/o ASCUS paps, last several have been normal ? ? ? ? ?

## 2022-01-10 LAB — CYTOLOGY - PAP: Diagnosis: NEGATIVE

## 2022-01-11 DIAGNOSIS — M79641 Pain in right hand: Secondary | ICD-10-CM | POA: Diagnosis not present

## 2022-01-11 DIAGNOSIS — G894 Chronic pain syndrome: Secondary | ICD-10-CM | POA: Diagnosis not present

## 2022-01-11 DIAGNOSIS — F1120 Opioid dependence, uncomplicated: Secondary | ICD-10-CM | POA: Diagnosis not present

## 2022-01-11 DIAGNOSIS — G500 Trigeminal neuralgia: Secondary | ICD-10-CM | POA: Diagnosis not present

## 2022-01-24 DIAGNOSIS — F330 Major depressive disorder, recurrent, mild: Secondary | ICD-10-CM | POA: Diagnosis not present

## 2022-01-24 DIAGNOSIS — G4700 Insomnia, unspecified: Secondary | ICD-10-CM | POA: Diagnosis not present

## 2022-01-24 DIAGNOSIS — L989 Disorder of the skin and subcutaneous tissue, unspecified: Secondary | ICD-10-CM | POA: Diagnosis not present

## 2022-01-26 ENCOUNTER — Ambulatory Visit
Admission: RE | Admit: 2022-01-26 | Discharge: 2022-01-26 | Disposition: A | Discharge status: Home / Self Care | Payer: Medicare HMO | Source: Ambulatory Visit | Attending: Neurology | Admitting: Neurology

## 2022-01-26 DIAGNOSIS — R220 Localized swelling, mass and lump, head: Secondary | ICD-10-CM | POA: Diagnosis not present

## 2022-01-26 DIAGNOSIS — L989 Disorder of the skin and subcutaneous tissue, unspecified: Secondary | ICD-10-CM

## 2022-01-26 DIAGNOSIS — Q046 Congenital cerebral cysts: Secondary | ICD-10-CM | POA: Diagnosis not present

## 2022-02-22 DIAGNOSIS — G894 Chronic pain syndrome: Secondary | ICD-10-CM | POA: Diagnosis not present

## 2022-02-22 DIAGNOSIS — F1120 Opioid dependence, uncomplicated: Secondary | ICD-10-CM | POA: Diagnosis not present

## 2022-02-22 DIAGNOSIS — G500 Trigeminal neuralgia: Secondary | ICD-10-CM | POA: Diagnosis not present

## 2022-03-02 DIAGNOSIS — Z049 Encounter for examination and observation for unspecified reason: Secondary | ICD-10-CM | POA: Diagnosis not present

## 2022-03-02 DIAGNOSIS — G43019 Migraine without aura, intractable, without status migrainosus: Secondary | ICD-10-CM | POA: Diagnosis not present

## 2022-03-02 DIAGNOSIS — Z79899 Other long term (current) drug therapy: Secondary | ICD-10-CM | POA: Diagnosis not present

## 2022-03-08 DIAGNOSIS — R6889 Other general symptoms and signs: Secondary | ICD-10-CM | POA: Diagnosis not present

## 2022-03-08 DIAGNOSIS — F331 Major depressive disorder, recurrent, moderate: Secondary | ICD-10-CM | POA: Diagnosis not present

## 2022-03-08 DIAGNOSIS — G4700 Insomnia, unspecified: Secondary | ICD-10-CM | POA: Diagnosis not present

## 2022-04-05 DIAGNOSIS — G894 Chronic pain syndrome: Secondary | ICD-10-CM | POA: Diagnosis not present

## 2022-04-05 DIAGNOSIS — F1120 Opioid dependence, uncomplicated: Secondary | ICD-10-CM | POA: Diagnosis not present

## 2022-05-05 DIAGNOSIS — M545 Low back pain, unspecified: Secondary | ICD-10-CM | POA: Diagnosis not present

## 2022-05-05 DIAGNOSIS — F331 Major depressive disorder, recurrent, moderate: Secondary | ICD-10-CM | POA: Diagnosis not present

## 2022-05-05 DIAGNOSIS — G4700 Insomnia, unspecified: Secondary | ICD-10-CM | POA: Diagnosis not present

## 2022-05-16 DIAGNOSIS — G894 Chronic pain syndrome: Secondary | ICD-10-CM | POA: Diagnosis not present

## 2022-05-16 DIAGNOSIS — T402X5A Adverse effect of other opioids, initial encounter: Secondary | ICD-10-CM | POA: Diagnosis not present

## 2022-05-16 DIAGNOSIS — F1120 Opioid dependence, uncomplicated: Secondary | ICD-10-CM | POA: Diagnosis not present

## 2022-05-16 DIAGNOSIS — G500 Trigeminal neuralgia: Secondary | ICD-10-CM | POA: Diagnosis not present

## 2022-05-16 DIAGNOSIS — K5903 Drug induced constipation: Secondary | ICD-10-CM | POA: Diagnosis not present

## 2022-06-26 DIAGNOSIS — G500 Trigeminal neuralgia: Secondary | ICD-10-CM | POA: Diagnosis not present

## 2022-06-26 DIAGNOSIS — G894 Chronic pain syndrome: Secondary | ICD-10-CM | POA: Diagnosis not present

## 2022-06-30 ENCOUNTER — Ambulatory Visit
Admission: RE | Admit: 2022-06-30 | Discharge: 2022-06-30 | Disposition: A | Discharge status: Home / Self Care | Payer: Medicare HMO | Source: Ambulatory Visit | Attending: Obstetrics & Gynecology | Admitting: Obstetrics & Gynecology

## 2022-06-30 DIAGNOSIS — Z1231 Encounter for screening mammogram for malignant neoplasm of breast: Secondary | ICD-10-CM

## 2022-06-30 DIAGNOSIS — M8588 Other specified disorders of bone density and structure, other site: Secondary | ICD-10-CM | POA: Diagnosis not present

## 2022-06-30 DIAGNOSIS — M810 Age-related osteoporosis without current pathological fracture: Secondary | ICD-10-CM | POA: Diagnosis not present

## 2022-06-30 DIAGNOSIS — M816 Localized osteoporosis [Lequesne]: Secondary | ICD-10-CM

## 2022-06-30 DIAGNOSIS — Z780 Asymptomatic menopausal state: Secondary | ICD-10-CM | POA: Diagnosis not present

## 2022-07-24 ENCOUNTER — Telehealth (HOSPITAL_BASED_OUTPATIENT_CLINIC_OR_DEPARTMENT_OTHER): Payer: Self-pay | Admitting: Obstetrics & Gynecology

## 2022-07-24 NOTE — Telephone Encounter (Signed)
Patient called and wanted to know if she still needs to follow up with Dr.Miller about her bone density results. Patient stated her primary care Dr.Sharon Stephanie Acre has already started her on medication base on her bone density report.

## 2022-07-24 NOTE — Telephone Encounter (Signed)
Pt called back to let us know that her PCP has started her on fosamax to treat the osteoporosis that her bone density scan shows. Informed pt that I would pass that information along to Dr. Sabra Heck

## 2022-08-25 DIAGNOSIS — H6121 Impacted cerumen, right ear: Secondary | ICD-10-CM | POA: Diagnosis not present

## 2022-08-25 DIAGNOSIS — H9313 Tinnitus, bilateral: Secondary | ICD-10-CM | POA: Diagnosis not present

## 2022-08-29 DIAGNOSIS — H5203 Hypermetropia, bilateral: Secondary | ICD-10-CM | POA: Diagnosis not present

## 2022-08-30 DIAGNOSIS — M818 Other osteoporosis without current pathological fracture: Secondary | ICD-10-CM | POA: Diagnosis not present

## 2022-08-30 DIAGNOSIS — Z0000 Encounter for general adult medical examination without abnormal findings: Secondary | ICD-10-CM | POA: Diagnosis not present

## 2022-08-30 DIAGNOSIS — Z79899 Other long term (current) drug therapy: Secondary | ICD-10-CM | POA: Diagnosis not present

## 2022-08-30 DIAGNOSIS — F419 Anxiety disorder, unspecified: Secondary | ICD-10-CM | POA: Diagnosis not present

## 2022-08-30 DIAGNOSIS — E039 Hypothyroidism, unspecified: Secondary | ICD-10-CM | POA: Diagnosis not present

## 2022-08-30 DIAGNOSIS — G4700 Insomnia, unspecified: Secondary | ICD-10-CM | POA: Diagnosis not present

## 2022-08-30 DIAGNOSIS — G479 Sleep disorder, unspecified: Secondary | ICD-10-CM | POA: Diagnosis not present

## 2022-08-30 DIAGNOSIS — E785 Hyperlipidemia, unspecified: Secondary | ICD-10-CM | POA: Diagnosis not present

## 2022-08-30 DIAGNOSIS — M810 Age-related osteoporosis without current pathological fracture: Secondary | ICD-10-CM | POA: Diagnosis not present

## 2022-08-30 DIAGNOSIS — F331 Major depressive disorder, recurrent, moderate: Secondary | ICD-10-CM | POA: Diagnosis not present

## 2022-09-19 DIAGNOSIS — G894 Chronic pain syndrome: Secondary | ICD-10-CM | POA: Diagnosis not present

## 2022-11-15 IMAGING — CT CT HEAD W/O CM
1 series · 15 of 30 positions shown, 19 images · non-contrast
Comparison: 04/13/2017 CT head.

CLINICAL DATA: Lump on top of head



[Series 2: head w/(date) · axial · 0.46mm/px · z∈[-340,-180]mm · 15 of 36 slices shown, 19 images]
[im 2/36  brain]
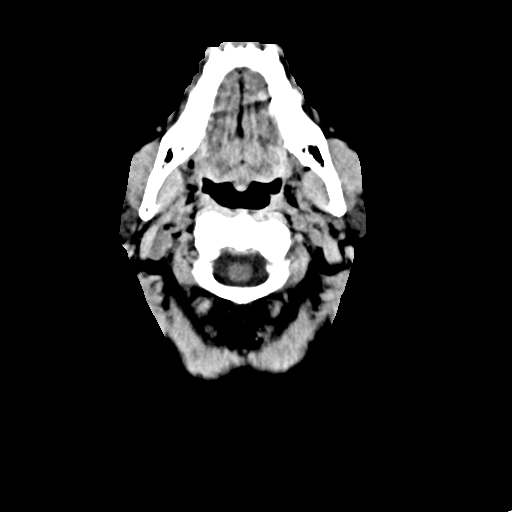
[im 2/36  bone]
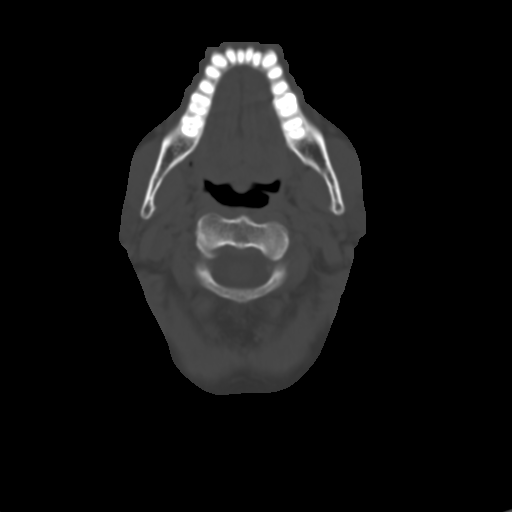
[im 4/36  brain]
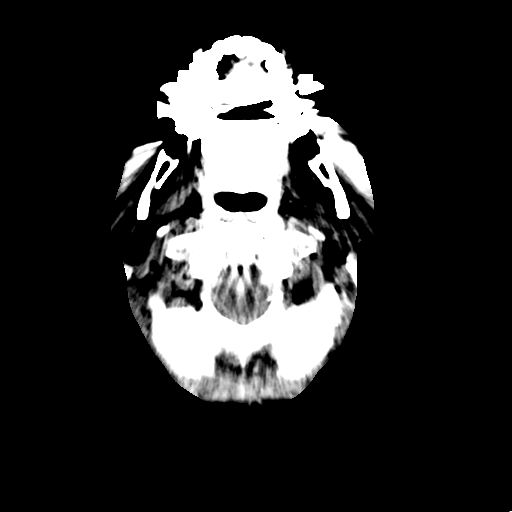
[im 7/36  brain]
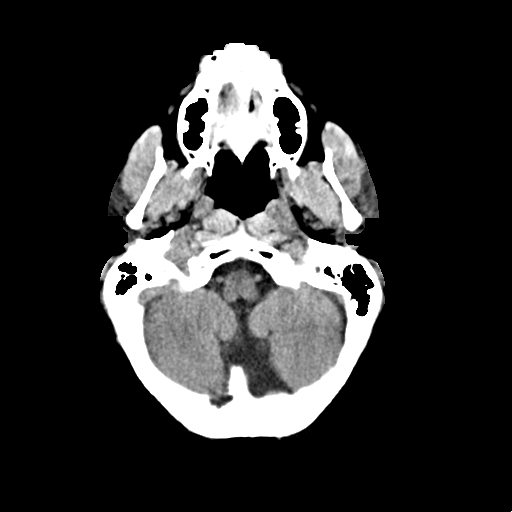
[im 9/36  brain]
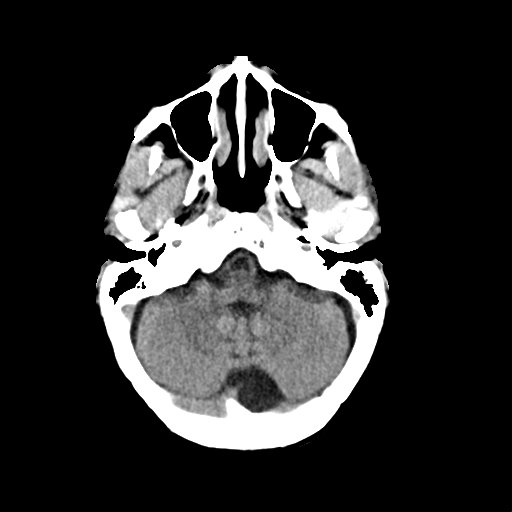
[im 11/36  brain]
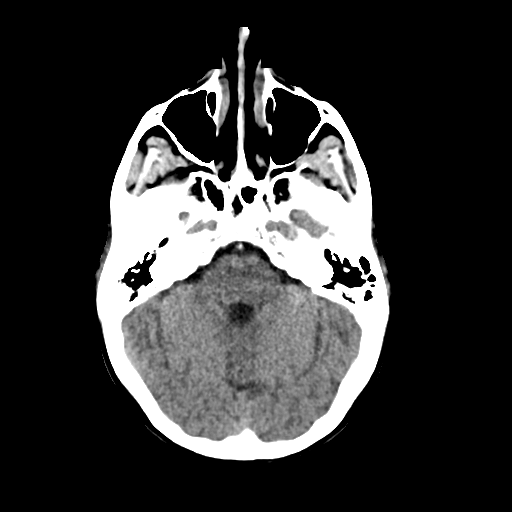
[im 11/36  bone]
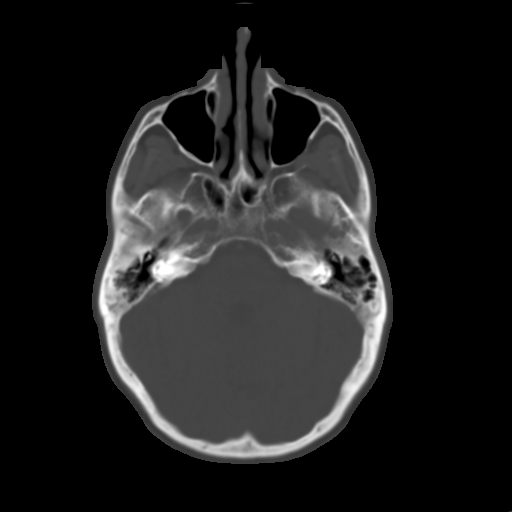
[im 14/36  brain]
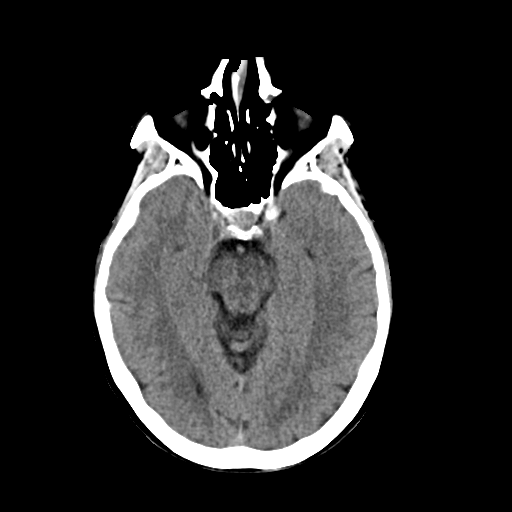
[im 16/36  brain]
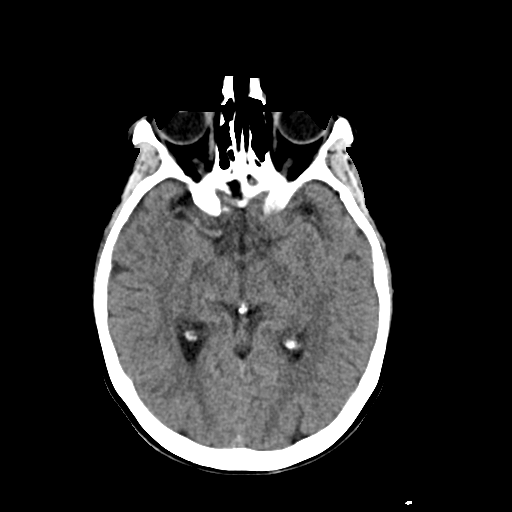
[im 19/36  brain]
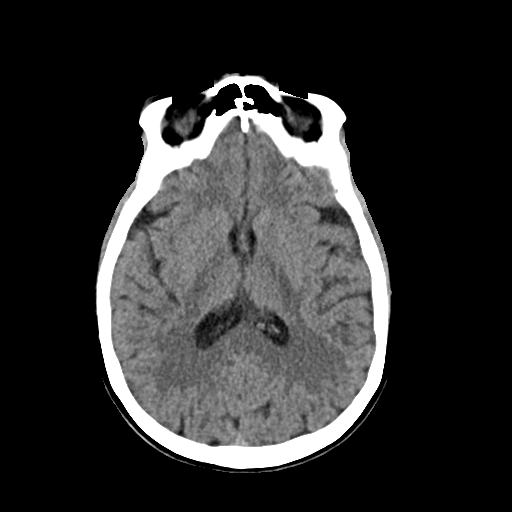
[im 20/36  brain]
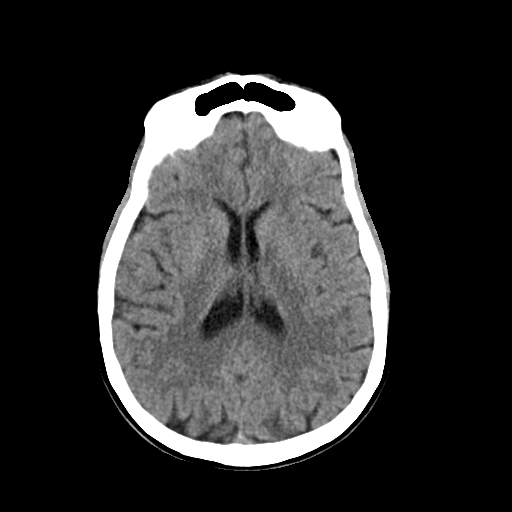
[im 20/36  bone]
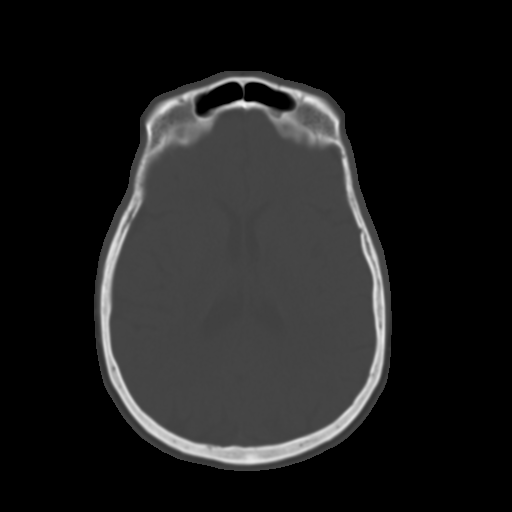
[im 22/36  brain]
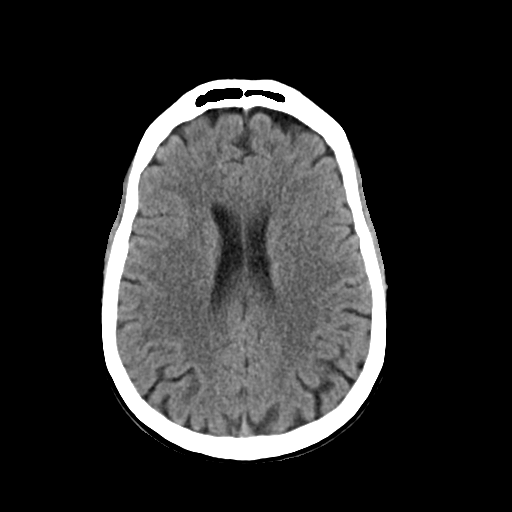
[im 25/36  brain]
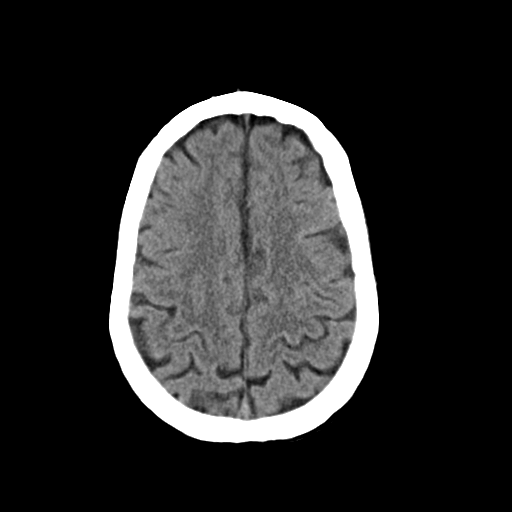
[im 27/36  brain]
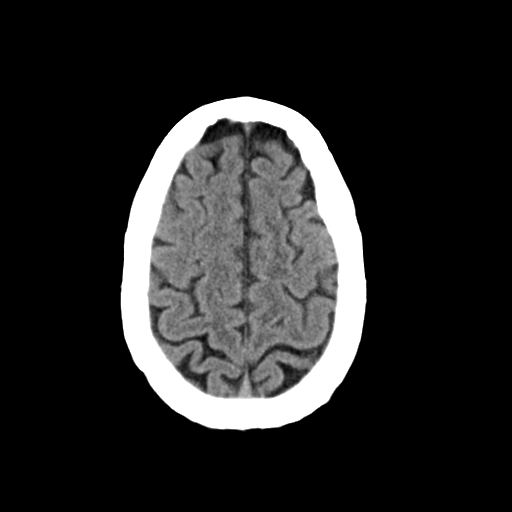
[im 29/36  brain]
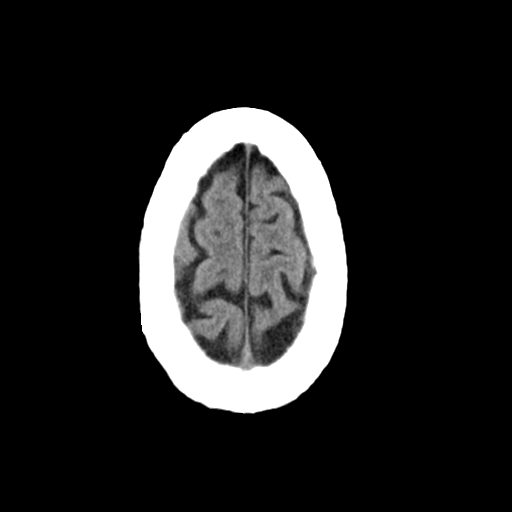
[im 29/36  bone]
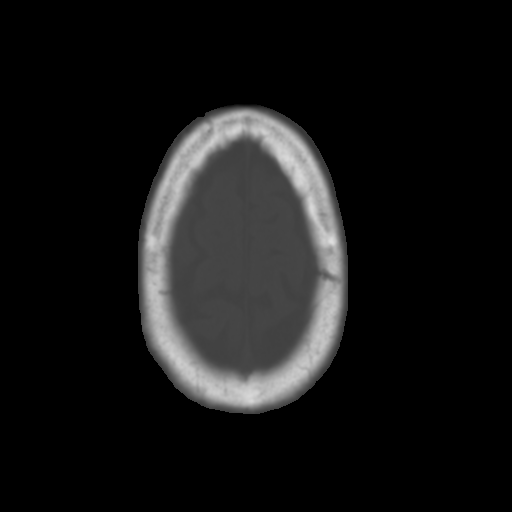
[im 32/36  brain]
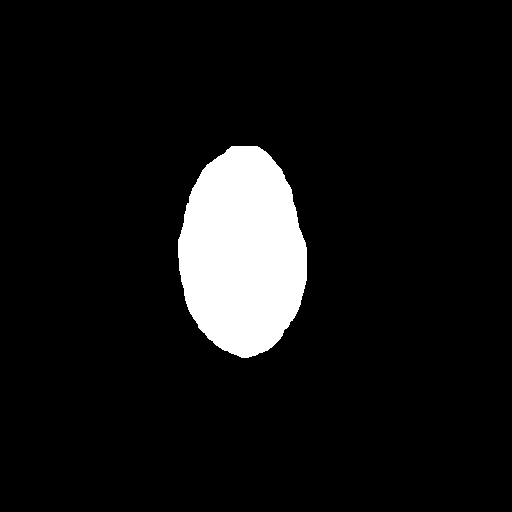
[im 34/36  brain]
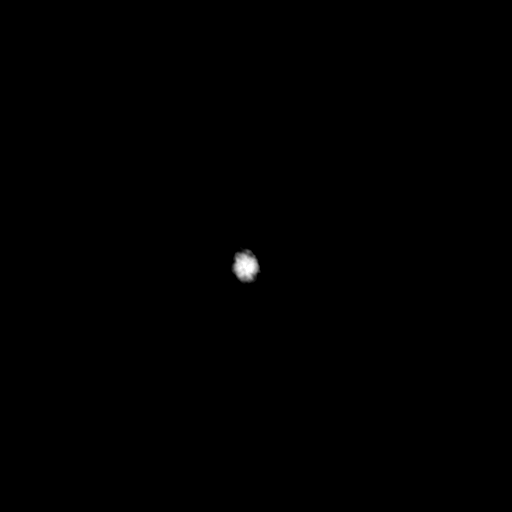

[15 of 30 positions shown; findings below may reference images not displayed]

FINDINGS: Brain: No evidence of acute infarction, hemorrhage, cerebral edema,
mass, mass effect, or midline shift. No hydrocephalus or extra-axial
fluid collection. Left posterior fossa arachnoid cyst.

Vascular: No hyperdense vessel.

Skull: Normal. Negative for fracture or focal lesion.

Sinuses/Orbits: No acute finding. The mastoids are well aerated.

Other: No abnormality is seen in the soft tissues of the scalp. No
radiopaque marker is seen to delineate the palpated abnormality.
IMPRESSION: IMPRESSION
1. No acute intracranial process.
2. No abnormality is seen in the calvarium or scalp soft tissues.

## 2022-12-19 DIAGNOSIS — G894 Chronic pain syndrome: Secondary | ICD-10-CM | POA: Diagnosis not present

## 2022-12-19 DIAGNOSIS — Z6827 Body mass index (BMI) 27.0-27.9, adult: Secondary | ICD-10-CM | POA: Diagnosis not present

## 2023-02-28 DIAGNOSIS — Z9181 History of falling: Secondary | ICD-10-CM | POA: Diagnosis not present

## 2023-02-28 DIAGNOSIS — F3341 Major depressive disorder, recurrent, in partial remission: Secondary | ICD-10-CM | POA: Diagnosis not present

## 2023-03-20 DIAGNOSIS — Z6827 Body mass index (BMI) 27.0-27.9, adult: Secondary | ICD-10-CM | POA: Diagnosis not present

## 2023-03-20 DIAGNOSIS — G500 Trigeminal neuralgia: Secondary | ICD-10-CM | POA: Diagnosis not present

## 2023-03-20 DIAGNOSIS — G894 Chronic pain syndrome: Secondary | ICD-10-CM | POA: Diagnosis not present

## 2023-03-29 DIAGNOSIS — H25812 Combined forms of age-related cataract, left eye: Secondary | ICD-10-CM | POA: Diagnosis not present

## 2023-03-29 DIAGNOSIS — H25811 Combined forms of age-related cataract, right eye: Secondary | ICD-10-CM | POA: Diagnosis not present

## 2023-03-29 DIAGNOSIS — H25813 Combined forms of age-related cataract, bilateral: Secondary | ICD-10-CM | POA: Diagnosis not present

## 2023-04-26 DIAGNOSIS — H25812 Combined forms of age-related cataract, left eye: Secondary | ICD-10-CM | POA: Diagnosis not present

## 2023-05-24 DIAGNOSIS — H25811 Combined forms of age-related cataract, right eye: Secondary | ICD-10-CM | POA: Diagnosis not present

## 2023-05-31 ENCOUNTER — Other Ambulatory Visit: Payer: Self-pay | Admitting: Obstetrics & Gynecology

## 2023-05-31 DIAGNOSIS — Z1231 Encounter for screening mammogram for malignant neoplasm of breast: Secondary | ICD-10-CM

## 2023-06-13 DIAGNOSIS — G894 Chronic pain syndrome: Secondary | ICD-10-CM | POA: Diagnosis not present

## 2023-06-13 DIAGNOSIS — Z6827 Body mass index (BMI) 27.0-27.9, adult: Secondary | ICD-10-CM | POA: Diagnosis not present

## 2023-06-20 DIAGNOSIS — Z23 Encounter for immunization: Secondary | ICD-10-CM | POA: Diagnosis not present

## 2023-06-20 DIAGNOSIS — F331 Major depressive disorder, recurrent, moderate: Secondary | ICD-10-CM | POA: Diagnosis not present

## 2023-06-20 DIAGNOSIS — R4189 Other symptoms and signs involving cognitive functions and awareness: Secondary | ICD-10-CM | POA: Diagnosis not present

## 2023-07-03 ENCOUNTER — Ambulatory Visit
Admission: RE | Admit: 2023-07-03 | Discharge: 2023-07-03 | Disposition: A | Discharge status: Home / Self Care | Payer: Medicare HMO | Source: Ambulatory Visit | Attending: Obstetrics & Gynecology | Admitting: Obstetrics & Gynecology

## 2023-07-03 DIAGNOSIS — Z1231 Encounter for screening mammogram for malignant neoplasm of breast: Secondary | ICD-10-CM

## 2023-08-30 DIAGNOSIS — G894 Chronic pain syndrome: Secondary | ICD-10-CM | POA: Diagnosis not present

## 2023-09-03 DIAGNOSIS — Z79899 Other long term (current) drug therapy: Secondary | ICD-10-CM | POA: Diagnosis not present

## 2023-09-03 DIAGNOSIS — F331 Major depressive disorder, recurrent, moderate: Secondary | ICD-10-CM | POA: Diagnosis not present

## 2023-09-03 DIAGNOSIS — M62838 Other muscle spasm: Secondary | ICD-10-CM | POA: Diagnosis not present

## 2023-09-03 DIAGNOSIS — E538 Deficiency of other specified B group vitamins: Secondary | ICD-10-CM | POA: Diagnosis not present

## 2023-09-03 DIAGNOSIS — G894 Chronic pain syndrome: Secondary | ICD-10-CM | POA: Diagnosis not present

## 2023-09-03 DIAGNOSIS — Z0000 Encounter for general adult medical examination without abnormal findings: Secondary | ICD-10-CM | POA: Diagnosis not present

## 2023-09-03 DIAGNOSIS — M818 Other osteoporosis without current pathological fracture: Secondary | ICD-10-CM | POA: Diagnosis not present

## 2023-09-03 DIAGNOSIS — E785 Hyperlipidemia, unspecified: Secondary | ICD-10-CM | POA: Diagnosis not present

## 2023-09-03 DIAGNOSIS — E039 Hypothyroidism, unspecified: Secondary | ICD-10-CM | POA: Diagnosis not present

## 2023-09-03 DIAGNOSIS — Z131 Encounter for screening for diabetes mellitus: Secondary | ICD-10-CM | POA: Diagnosis not present

## 2024-02-08 ENCOUNTER — Encounter (HOSPITAL_BASED_OUTPATIENT_CLINIC_OR_DEPARTMENT_OTHER): Payer: Self-pay | Admitting: Obstetrics & Gynecology

## 2024-02-08 ENCOUNTER — Other Ambulatory Visit (HOSPITAL_COMMUNITY)
Admission: RE | Admit: 2024-02-08 | Discharge: 2024-02-08 | Disposition: A | Discharge status: Home / Self Care | Source: Ambulatory Visit | Attending: Obstetrics & Gynecology | Admitting: Obstetrics & Gynecology

## 2024-02-08 ENCOUNTER — Ambulatory Visit (HOSPITAL_BASED_OUTPATIENT_CLINIC_OR_DEPARTMENT_OTHER): Payer: Medicare HMO | Admitting: Obstetrics & Gynecology

## 2024-02-08 VITALS — BP 119/74 | HR 58 | Ht 62.0 in | Wt 134.0 lb

## 2024-02-08 DIAGNOSIS — M810 Age-related osteoporosis without current pathological fracture: Secondary | ICD-10-CM | POA: Diagnosis not present

## 2024-02-08 DIAGNOSIS — Z01419 Encounter for gynecological examination (general) (routine) without abnormal findings: Secondary | ICD-10-CM

## 2024-02-08 DIAGNOSIS — Z780 Asymptomatic menopausal state: Secondary | ICD-10-CM

## 2024-02-08 DIAGNOSIS — Z124 Encounter for screening for malignant neoplasm of cervix: Secondary | ICD-10-CM | POA: Diagnosis not present

## 2024-02-08 DIAGNOSIS — Z1231 Encounter for screening mammogram for malignant neoplasm of breast: Secondary | ICD-10-CM

## 2024-02-08 DIAGNOSIS — Z9189 Other specified personal risk factors, not elsewhere classified: Secondary | ICD-10-CM

## 2024-02-08 MED ORDER — ALENDRONATE SODIUM 70 MG PO TABS
70.0000 mg | ORAL_TABLET | ORAL | Status: AC
Start: 2024-02-08 — End: ?

## 2024-02-08 NOTE — Progress Notes (Signed)
 Breast and Pelvic Exam Patient name: Brittney [name] MRN [id number]  Date of birth: 1956-08-05 Chief Complaint:   Breast and Pelvic Exam  History of Present Illness:   Brittney [name] is a 68 y.o. G0P0 Caucasian female being seen today for a routine annual exam. Doing well.  Denies vaginal bleeding.  Had lab work done in November with Dr. Alexia Angelucci.  This was good.    Patient's last menstrual period was 12/08/2011 (exact date).  Last pap  01/09/2022. Results were: NILM w/ HRHPV negative. H/O abnormal pap: yes h/o +HR HPV testing Last mammogram: 07/03/2023. Results were: normal. Family h/o breast cancer: no Last colonoscopy: 03/20/2018 . Results were: normal. Follow up 10 years     02/08/2024    8:14 AM 01/09/2022    8:34 AM  Depression screen PHQ 2/9  Decreased Interest 0 0  Down, Depressed, Hopeless 0 0  PHQ - 2 Score 0 0    Review of Systems:   Pertinent items are noted in HPI  Denies any vaginal discharge, urinary issues or bowel changes. Pertinent History Reviewed:  Reviewed past medical,surgical, social and family history.  Reviewed problem list, medications and allergies. Physical Assessment:   Vitals:   02/08/24 0807  BP: 119/74  Pulse: (!) 58  Weight: 134 lb (60.8 kg)  Height: 5\' 2"  (1.575 m)  Body mass index is 24.51 kg/m.        Physical Examination:   General appearance - well appearing, and in no distress  Mental status - alert, oriented to person, place, and time  Psych:  She has a normal mood and affect  Skin - warm and dry, normal color, no suspicious lesions noted  Chest - effort normal, all lung fields clear to auscultation bilaterally  Heart - normal rate and regular rhythm  Neck:  midline trachea, no thyromegaly or nodules  Breasts - breasts appear normal, no suspicious masses, no skin or nipple changes or  axillary nodes  Abdomen - soft, nontender, nondistended, no masses or organomegaly  Pelvic - VULVA: normal appearing vulva with no masses,  tenderness or lesions    VAGINA: normal appearing vagina with normal color and discharge, no lesions    CERVIX: normal appearing cervix without discharge or lesions, no CMT  Thin prep pap is updated today  UTERUS: uterus is felt to be normal size, shape, consistency and nontender   ADNEXA: No adnexal masses or tenderness noted.  Rectal - normal rectal, good sphincter tone, no masses felt.  Extremities:  No swelling or varicosities noted  Chaperone present for exam  Assessment & Plan:  1. GYN exam for high-risk Medicare patient (Primary) - Pap smear obtained today - Mammogram 06/2023 - Colonoscopy 2019, follow up 10 years - Bone mineral density 2023, osteoporosis - lab work done with PCP, Dr. Alexia Angelucci - vaccines reviewed/updated  2. Postmenopausal - not on HRT  3. Cervical cancer screening - Cytology - PAP( [place]) - PR OBTAINING SCREEN PAP SMEAR  4. Osteoporosis of forearm - will repeat BMD this year.  She is on Fosamax.  Vit D and calcium levels reviewed. - DG BONE DENSITY (DXA); Future - alendronate (FOSAMAX) 70 MG tablet; Take 1 tablet (70 mg total) by mouth every 7 (seven) days. Take first thing in am with 6 oz. Water.  Be upright after taking.  Eat nothing for one hour.  5. Encounter for screening mammogram for malignant neoplasm of breast - MM 3D SCREENING MAMMOGRAM BILATERAL BREAST; Future   Orders  Placed This Encounter  Procedures   PR OBTAINING SCREEN PAP SMEAR    Meds: No orders of the defined types were placed in this encounter.   Follow-up: No follow-ups on file.  Lillian Rein, MD 02/08/2024 8:35 AM

## 2024-02-12 ENCOUNTER — Encounter (HOSPITAL_BASED_OUTPATIENT_CLINIC_OR_DEPARTMENT_OTHER): Payer: Self-pay | Admitting: Obstetrics & Gynecology

## 2024-02-12 LAB — CYTOLOGY - PAP: Diagnosis: NEGATIVE

## 2024-02-20 DIAGNOSIS — H903 Sensorineural hearing loss, bilateral: Secondary | ICD-10-CM | POA: Diagnosis not present

## 2024-02-20 DIAGNOSIS — H7291 Unspecified perforation of tympanic membrane, right ear: Secondary | ICD-10-CM | POA: Diagnosis not present

## 2024-02-28 DIAGNOSIS — F331 Major depressive disorder, recurrent, moderate: Secondary | ICD-10-CM | POA: Diagnosis not present

## 2024-02-28 DIAGNOSIS — G4700 Insomnia, unspecified: Secondary | ICD-10-CM | POA: Diagnosis not present

## 2024-03-08 DIAGNOSIS — F3341 Major depressive disorder, recurrent, in partial remission: Secondary | ICD-10-CM | POA: Diagnosis not present

## 2024-03-08 DIAGNOSIS — E039 Hypothyroidism, unspecified: Secondary | ICD-10-CM | POA: Diagnosis not present

## 2024-03-08 DIAGNOSIS — M818 Other osteoporosis without current pathological fracture: Secondary | ICD-10-CM | POA: Diagnosis not present

## 2024-03-08 DIAGNOSIS — F331 Major depressive disorder, recurrent, moderate: Secondary | ICD-10-CM | POA: Diagnosis not present

## 2024-03-24 DIAGNOSIS — H7291 Unspecified perforation of tympanic membrane, right ear: Secondary | ICD-10-CM | POA: Diagnosis not present

## 2024-03-28 DIAGNOSIS — H9011 Conductive hearing loss, unilateral, right ear, with unrestricted hearing on the contralateral side: Secondary | ICD-10-CM | POA: Diagnosis not present

## 2024-05-08 DIAGNOSIS — F331 Major depressive disorder, recurrent, moderate: Secondary | ICD-10-CM | POA: Diagnosis not present

## 2024-05-08 DIAGNOSIS — E039 Hypothyroidism, unspecified: Secondary | ICD-10-CM | POA: Diagnosis not present

## 2024-05-08 DIAGNOSIS — F3341 Major depressive disorder, recurrent, in partial remission: Secondary | ICD-10-CM | POA: Diagnosis not present

## 2024-05-08 DIAGNOSIS — M818 Other osteoporosis without current pathological fracture: Secondary | ICD-10-CM | POA: Diagnosis not present

## 2024-06-08 DIAGNOSIS — F3341 Major depressive disorder, recurrent, in partial remission: Secondary | ICD-10-CM | POA: Diagnosis not present

## 2024-06-08 DIAGNOSIS — E039 Hypothyroidism, unspecified: Secondary | ICD-10-CM | POA: Diagnosis not present

## 2024-06-08 DIAGNOSIS — M818 Other osteoporosis without current pathological fracture: Secondary | ICD-10-CM | POA: Diagnosis not present

## 2024-06-08 DIAGNOSIS — F331 Major depressive disorder, recurrent, moderate: Secondary | ICD-10-CM | POA: Diagnosis not present

## 2024-07-03 ENCOUNTER — Ambulatory Visit
Admission: RE | Admit: 2024-07-03 | Discharge: 2024-07-03 | Disposition: A | Discharge status: Home / Self Care | Source: Ambulatory Visit | Attending: Obstetrics & Gynecology | Admitting: Obstetrics & Gynecology

## 2024-07-03 DIAGNOSIS — Z1231 Encounter for screening mammogram for malignant neoplasm of breast: Secondary | ICD-10-CM

## 2024-07-21 ENCOUNTER — Other Ambulatory Visit (HOSPITAL_BASED_OUTPATIENT_CLINIC_OR_DEPARTMENT_OTHER): Payer: Self-pay | Admitting: Family Medicine

## 2024-07-21 DIAGNOSIS — E7800 Pure hypercholesterolemia, unspecified: Secondary | ICD-10-CM

## 2024-08-07 ENCOUNTER — Ambulatory Visit (HOSPITAL_BASED_OUTPATIENT_CLINIC_OR_DEPARTMENT_OTHER)
Admission: RE | Admit: 2024-08-07 | Discharge: 2024-08-07 | Disposition: A | Discharge status: Home / Self Care | Payer: Self-pay | Source: Ambulatory Visit | Attending: Family Medicine | Admitting: Family Medicine

## 2024-08-07 ENCOUNTER — Ambulatory Visit (HOSPITAL_BASED_OUTPATIENT_CLINIC_OR_DEPARTMENT_OTHER)
Admission: RE | Admit: 2024-08-07 | Discharge: 2024-08-07 | Disposition: A | Discharge status: Home / Self Care | Source: Ambulatory Visit | Attending: Obstetrics & Gynecology | Admitting: Obstetrics & Gynecology

## 2024-08-07 DIAGNOSIS — M810 Age-related osteoporosis without current pathological fracture: Secondary | ICD-10-CM | POA: Diagnosis present

## 2024-08-07 DIAGNOSIS — E7800 Pure hypercholesterolemia, unspecified: Secondary | ICD-10-CM | POA: Insufficient documentation

## 2024-08-11 ENCOUNTER — Ambulatory Visit (HOSPITAL_BASED_OUTPATIENT_CLINIC_OR_DEPARTMENT_OTHER): Payer: Self-pay | Admitting: Obstetrics & Gynecology

## 2024-08-12 ENCOUNTER — Telehealth (HOSPITAL_BASED_OUTPATIENT_CLINIC_OR_DEPARTMENT_OTHER): Payer: Self-pay

## 2024-08-12 NOTE — Telephone Encounter (Signed)
 Called patient. DOB verified. Patient informed of results and recommendations. Patient states she is ok with being referred to the osteoporosis clinic. Please place the referral. tbw

## 2024-08-19 ENCOUNTER — Encounter: Payer: Self-pay | Admitting: Family Medicine

## 2024-08-20 ENCOUNTER — Other Ambulatory Visit (HOSPITAL_BASED_OUTPATIENT_CLINIC_OR_DEPARTMENT_OTHER): Payer: Self-pay | Admitting: Obstetrics & Gynecology

## 2024-08-20 ENCOUNTER — Other Ambulatory Visit: Payer: Self-pay | Admitting: Family Medicine

## 2024-08-20 DIAGNOSIS — K769 Liver disease, unspecified: Secondary | ICD-10-CM

## 2024-08-20 DIAGNOSIS — M810 Age-related osteoporosis without current pathological fracture: Secondary | ICD-10-CM

## 2024-08-20 NOTE — Telephone Encounter (Signed)
 Referral place to osteoporosis clinic at [facility].  Pt will be contacted directly by that office for scheduling.

## 2024-08-25 ENCOUNTER — Encounter: Payer: Self-pay | Admitting: Physician Assistant

## 2024-08-25 ENCOUNTER — Ambulatory Visit: Admitting: Physician Assistant

## 2024-08-25 VITALS — Ht 62.0 in | Wt 134.0 lb

## 2024-08-25 DIAGNOSIS — M810 Age-related osteoporosis without current pathological fracture: Secondary | ICD-10-CM | POA: Insufficient documentation

## 2024-08-25 MED ORDER — ROMOSOZUMAB-AQQG 105 MG/1.17ML ~~LOC~~ SOSY: 210.0000 mg | PREFILLED_SYRINGE | Freq: Once | SUBCUTANEOUS | Status: AC

## 2024-08-25 NOTE — Addendum Note (Signed)
 Addended by: LYNNANN HARVEY E on: 08/25/2024 04:01 PM   Modules accepted: Orders

## 2024-08-25 NOTE — Progress Notes (Signed)
 Office Visit Note   Patient: Brittney [name]           Date of Birth: 1956/08/14           MRN: [id number] Visit Date: 08/25/2024              Requested by: Cleotilde Ronal RAMAN, MD [address] [place],  [place] [address] PCP: Chet Mad, DO   Assessment & Plan: Visit Diagnoses:  1. Age-related osteoporosis without current pathological fracture     Plan: Patient is a pleasant 68 year old woman referred for evaluation of osteoporosis.  She has had previous bone density scans which showed osteoporosis and has been placed on Fosamax .  Unfortunately her most recent bone density scan did not show any improvement if not deterioration in her bone density scores.  She has no recent history of fracture.  No history of cardiac disease or cancer.  She does not have kidney disease, ulcer disease.  No reflux.  She does not have a history of epilepsy.  She went through menopause at 11 to 68 years of age did not do hormone replacement therapy she takes 1200 mg of calcium a day and 5000 international units of vitamin D .  Both these were recently checked by her primary care and found to be normal.  She does elliptical 5 times a week and also participates in strength training.  She has had no major dental work and does not have a family history of hip or spine fracture.  I spent 45 minutes reviewing her chart labs and speaking with her about medications.  She has not had any improvement and had some deterioration since she has been on Fosamax .  Because of this I think the next appropriate would be to try to do an anabolic such as Evenity or Tymlos.  She is willing to do either if 1 was more covered by her insurance.  At the very least if she cannot get these approved I would recommend Prolia as this would be superior to her current Fosamax .  All of her questions were answered we talked about side effects of medications.  Risks of treatment.  Follow-Up Instructions: Return if symptoms worsen or  fail to improve.   Orders:  No orders of the defined types were placed in this encounter.  No orders of the defined types were placed in this encounter.     Procedures: No procedures performed   Clinical Data: No additional findings.   Subjective: No chief complaint on file.   HPI pleasant 68 year old woman who is referred by [name] for evaluation of osteoporosis  Review of Systems  All other systems reviewed and are negative.    Objective: Vital Signs: Ht 5' 2 (1.575 m)   Wt 134 lb (60.8 kg)   LMP 12/08/2011 (Exact Date)   BMI 24.51 kg/m   Physical Exam Constitutional:      Appearance: Normal appearance.  Pulmonary:     Effort: Pulmonary effort is normal.  Skin:    General: Skin is warm and dry.  Neurological:     General: No focal deficit present.     Mental Status: She is alert and oriented to person, place, and time.       Specialty Comments:  No specialty comments available.  Imaging: No results found.   PMFS History: Patient Active Problem List   Diagnosis Date Noted   Age-related osteoporosis without current pathological fracture 08/25/2024   Postconcussion syndrome 09/10/2017   Benign  paroxysmal positional vertigo of right ear 06/15/2017   Chronic pain syndrome 10/20/2014   Depression 07/24/2014   Disc disease, degenerative, cervical 07/10/2014   Ulnar neuropathy 07/10/2014   Right wrist tendinitis 03/31/2014   Muscle tiredness 03/31/2014   Past Medical History:  Diagnosis Date   Anxiety    Arthritis    knees and hands   Depression    GERD (gastroesophageal reflux disease)    Headache(784.0)    Trigeminal neuralgia     Family History  Problem Relation Age of Onset   COPD Mother        Deceased, 90   Heart disease Father        Deceased, 21   Diabetes Brother    Stroke Brother    Breast cancer Neg Hx     Past Surgical History:  Procedure Laterality Date   ANKLE ARTHROPLASTY  2008   left   CARPAL TUNNEL  RELEASE  2009   rt   CARPOMETACARPAL (CMC) FUSION OF THUMB Left 04/2019   cortisone injection     total of 12 injections in muscle around neck, right shoulder, and spine   EAR CYST EXCISION  09/18/2011   Procedure: CYST REMOVAL;  Surgeon: Ida VEAR Loader, MD;  Location: [facility];  Service: ENT;  Laterality: Right;  right facial cyst   EPIDURAL BLOCK INJECTION     in back and knee   HAND ARTHROPLASTY  1989   lt   JOINT REPLACEMENT  2009   rt total knee   JOINT REPLACEMENT Left 05/06/2019   thumb joint, Dr. Camella   KNEE ARTHROPLASTY  2010   revision rt total knee   KNEE ARTHROSCOPY  1973   rt   PILONIDAL CYST / SINUS EXCISION  1977   SEPTOPLASTY  2007   SINUS EXPLORATION  2007   TONSILLECTOMY     TRIGGER FINGER RELEASE Left 07/05/2017   Social History   Occupational History   Occupation: retired  Tobacco Use   Smoking status: Never   Smokeless tobacco: Never  Vaping Use   Vaping status: Never Used  Substance and Sexual Activity   Alcohol use: No    Alcohol/week: 0.0 standard drinks of alcohol   Drug use: No   Sexual activity: Not Currently    Partners: Female    Birth control/protection: Post-menopausal

## 2024-09-22 ENCOUNTER — Telehealth: Payer: Self-pay | Admitting: Physician Assistant

## 2024-09-22 NOTE — Telephone Encounter (Signed)
 Pt called requesting a call back from [facility]. Pt states she left a mychart message and asking next plan of care about her medications she take for osteoporosis. Please call pt at [phone].

## 2024-09-23 ENCOUNTER — Other Ambulatory Visit: Payer: Self-pay | Admitting: Radiology

## 2024-09-23 MED ORDER — TYMLOS 3120 MCG/1.56ML ~~LOC~~ SOPN: 80.0000 ug | PEN_INJECTOR | Freq: Every day | SUBCUTANEOUS | 12 refills | Status: DC

## 2024-09-23 MED ORDER — TYMLOS 3120 MCG/1.56ML ~~LOC~~ SOPN: 80.0000 ug | PEN_INJECTOR | Freq: Every day | SUBCUTANEOUS | 12 refills | Status: AC

## 2024-09-25 ENCOUNTER — Inpatient Hospital Stay: Admission: RE | Admit: 2024-09-25 | Discharge: 2024-09-25 | Attending: Family Medicine

## 2024-09-25 DIAGNOSIS — K769 Liver disease, unspecified: Secondary | ICD-10-CM

## 2024-09-25 MED ORDER — GADOPICLENOL 0.5 MMOL/ML IV SOLN
6.0000 mL | Freq: Once | INTRAVENOUS | Status: AC | PRN
  Administered 2024-09-25: 14:00:00 6 mL via INTRAVENOUS

## 2024-10-06 ENCOUNTER — Ambulatory Visit: Admitting: Physician Assistant

## 2024-10-28 ENCOUNTER — Other Ambulatory Visit

## 2024-12-08 ENCOUNTER — Ambulatory Visit
